# Patient Record
Sex: Female | Born: 1990 | Race: Black or African American | Hispanic: No | Marital: Single | Smoking: Never smoker
Health system: Southern US, Community
[De-identification: ages and names within clinical notes are randomized; demographics above are authoritative.]

## PROBLEM LIST (undated history)

## (undated) ENCOUNTER — Inpatient Hospital Stay (HOSPITAL_COMMUNITY): Payer: Self-pay

## (undated) DIAGNOSIS — O139 Gestational [pregnancy-induced] hypertension without significant proteinuria, unspecified trimester: Secondary | ICD-10-CM

## (undated) DIAGNOSIS — Z3A38 38 weeks gestation of pregnancy: Secondary | ICD-10-CM

## (undated) HISTORY — DX: 38 weeks gestation of pregnancy: Z3A.38

## (undated) HISTORY — PX: WISDOM TOOTH EXTRACTION: SHX21

## (undated) HISTORY — DX: Gestational (pregnancy-induced) hypertension without significant proteinuria, unspecified trimester: O13.9

---

## 2011-01-26 ENCOUNTER — Inpatient Hospital Stay (INDEPENDENT_AMBULATORY_CARE_PROVIDER_SITE_OTHER)
Admission: RE | Admit: 2011-01-26 | Discharge: 2011-01-26 | Disposition: A | Discharge status: Home / Self Care | Payer: Medicaid Other | Source: Ambulatory Visit | Attending: Emergency Medicine | Admitting: Emergency Medicine

## 2011-01-26 ENCOUNTER — Ambulatory Visit (INDEPENDENT_AMBULATORY_CARE_PROVIDER_SITE_OTHER): Payer: Medicaid Other

## 2011-01-26 DIAGNOSIS — M79609 Pain in unspecified limb: Secondary | ICD-10-CM

## 2011-01-26 DIAGNOSIS — M25469 Effusion, unspecified knee: Secondary | ICD-10-CM

## 2011-01-26 LAB — POCT PREGNANCY, URINE: Preg Test, Ur: NEGATIVE

## 2011-07-03 ENCOUNTER — Emergency Department (HOSPITAL_COMMUNITY)
Admission: EM | Admit: 2011-07-03 | Discharge: 2011-07-03 | Disposition: A | Discharge status: Home / Self Care | Payer: Self-pay | Attending: Emergency Medicine | Admitting: Emergency Medicine

## 2011-07-03 ENCOUNTER — Encounter (HOSPITAL_COMMUNITY): Payer: Self-pay | Admitting: Emergency Medicine

## 2011-07-03 DIAGNOSIS — R45851 Suicidal ideations: Secondary | ICD-10-CM | POA: Insufficient documentation

## 2011-07-03 LAB — RAPID URINE DRUG SCREEN, HOSP PERFORMED
Barbiturates: NOT DETECTED
Benzodiazepines: NOT DETECTED
Cocaine: NOT DETECTED

## 2011-07-03 NOTE — ED Provider Notes (Addendum)
History     CSN: [id number]  Arrival date & time 07/03/11  2034   First MD Initiated Contact with Patient 07/03/11 2137      Chief Complaint  Patient presents with  . Suicidal  . Medical Clearance  . Drug Overdose    Questionable    (Consider location/radiation/quality/duration/timing/severity/associated sxs/prior treatment) Patient is a 21 y.o. female presenting with mental health disorder. The history is provided by the patient.  Mental Health Problem The primary symptoms include dysphoric mood. Primary symptoms comment: Patient's boyfriend broke up with her today and she is very sad and stated that she mentions suicide The current episode started today. This is a new problem.  The onset of the illness is precipitated by emotional stress. The degree of incapacity that she is experiencing as a consequence of her illness is moderate. Additional symptoms of the illness do not include no insomnia, no appetite change, no unexpected weight change, no psychomotor retardation, no feelings of worthlessness, no decreased need for sleep, not distractible or no poor judgment. Associated symptoms comments: Patient states she is very sad after her boyfriend broke up with her today. She states she was so sad she mentioned to her friend she thought about suicide. She states that she would never actually hurt. She has no access to any weapons.. She admits to suicidal ideas. She does not have a plan to commit suicide. She does not contemplate harming herself. She has not already injured self. She does not contemplate injuring another person. Risk factors: No history of substance abuse, suicide attempts, mental illness.    History reviewed. No pertinent past medical history.  History reviewed. No pertinent past surgical history.  No family history on file.  History  Substance Use Topics  . Smoking status: Never Smoker   . Smokeless tobacco: Not on file  . Alcohol Use: No    OB History    Grav  Para Term Preterm Abortions TAB SAB Ect Mult Living                  Review of Systems  Constitutional: Negative for appetite change and unexpected weight change.  Psychiatric/Behavioral: Positive for dysphoric mood. The patient does not have insomnia.   All other systems reviewed and are negative.    Allergies  Review of patient's allergies indicates no known allergies.  Home Medications  No current outpatient prescriptions on file.  BP 133/78  Pulse 78  Temp 98 F (36.7 C)  Resp 16  SpO2 99%  LMP 06/30/2011  Physical Exam  Nursing note and vitals reviewed. Constitutional: She is oriented to person, place, and time. She appears well-developed and well-nourished. No distress.  HENT:  Head: Normocephalic and atraumatic.  Eyes: EOM are normal. Pupils are equal, round, and reactive to light.  Cardiovascular: Normal rate, regular rhythm, normal heart sounds and intact distal pulses.  Exam reveals no friction rub.   No murmur heard. Pulmonary/Chest: Effort normal and breath sounds normal. She has no wheezes. She has no rales.  Abdominal: Soft. Bowel sounds are normal. She exhibits no distension. There is no tenderness. There is no rebound and no guarding.  Musculoskeletal: Normal range of motion. She exhibits no tenderness.       No edema  Neurological: She is alert and oriented to person, place, and time. No cranial nerve deficit.  Skin: Skin is warm and dry. No rash noted.  Psychiatric: She has a normal mood and affect. Her behavior is normal. She expresses no homicidal  and no suicidal ideation.       Patient denies any suicidal ideation or plan here    ED Course  Procedures (including critical care time)  Labs Reviewed  URINE RAPID DRUG SCREEN (HOSP PERFORMED) - Abnormal; Notable for the following:    Tetrahydrocannabinol POSITIVE (*)    All other components within normal limits  PREGNANCY, URINE  CBC  COMPREHENSIVE METABOLIC PANEL  ETHANOL  ACETAMINOPHEN LEVEL    No results found.   No diagnosis found.    MDM   Patient brought in by EMS today do to possible suicidal ideation. On evaluation of the patient she states she broke up with her boyfriend today and is very sad and said that she thought about killing herself when she was very upset. Here patient is appropriate and states she would never hurt herself and that she was just upset. He has no history of depression or suicide in the past. She takes no medication except for ibuprofen for migraine headaches intermittently. She has a good support system and lives in the dorm close to her best friend. I spoke with her are a separately who corroborated her story. She states that she made no attempts to hurt herself and denies her ever having any issues in the past and she has known her for the past 2 years. Try to contact mother multiple times and went to voicemail. Patient is acting appropriate until she is appropriate for discharge.  Patient will be in a dorm and RA will insure that she is checked upon she also has a roommate.        Gwyneth Sprout, MD 07/03/11 0981  Gwyneth Sprout, MD 07/03/11 2314

## 2011-07-03 NOTE — ED Notes (Signed)
Dr Anitra Lauth bedside

## 2011-07-03 NOTE — ED Notes (Signed)
Attempt to call mother w/o success, left message to return call

## 2011-07-03 NOTE — ED Notes (Signed)
MD at bedside. 

## 2011-07-03 NOTE — ED Notes (Signed)
Pt presents via EMS, ? OD, SI, per EMS, presents with bottles of Vicodin, IBU, pills appear accounted for, pt non verbal, resp even unlabored, skin pwd, IV est PTA

## 2011-07-03 NOTE — ED Notes (Signed)
Pt mother calls ER, requesting info on pt, pt provided telephone to talk with mother.

## 2011-07-03 NOTE — ED Notes (Signed)
Sitter @ bedside

## 2011-07-03 NOTE — ED Notes (Signed)
Pt alert, speaks freely, admits problems with depression, denies Over dose, states took "normal dose of IBU" pt resp even unlabored, skin pwd

## 2011-07-03 NOTE — ED Notes (Signed)
[id number]<VW> Expected date:07/03/11<BR> Expected time: 8:23 PM<BR> Means of arrival:Ambulance<BR> Comments:<BR> EMS 50 GC - suicidal

## 2011-07-03 NOTE — ED Notes (Signed)
Pill bottles presented by EMS, Vicodin 5/500mg  12 pills of 20, filled 05/08/11. IBU 600mg  19 pills of 30 filled 05/08/11

## 2011-07-03 NOTE — Discharge Instructions (Signed)
Stress Management Stress is a state of physical or mental tension that often results from changes in your life or normal routine. Some common causes of stress are:  Death of a loved one.   Injuries or severe illnesses.   Getting fired or changing jobs.   Moving into a new home.  Other causes may be:  Sexual problems.   Business or financial losses.   Taking on a large debt.   Regular conflict with someone at home or at work.   Constant tiredness from lack of sleep.  It is not just bad things that are stressful. It may be stressful to:  Win the lottery.   Get married.   Buy a new car.  The amount of stress that can be easily tolerated varies from person to person. Changes generally cause stress, regardless of the types of change. Too much stress can affect your health. It may lead to physical or emotional problems. Too little stress (boredom) may also become stressful. SUGGESTIONS TO REDUCE STRESS:  Talk things over with your family and friends. It often is helpful to share your concerns and worries. If you feel your problem is serious, you may want to get help from a professional counselor.   Consider your problems one at a time instead of lumping them all together. Trying to take care of everything at once may seem impossible. List all the things you need to do and then start with the most important one. Set a goal to accomplish 2 or 3 things each day. If you expect to do too many in a single day you will naturally fail, causing you to feel even more stressed.   Do not use alcohol or drugs to relieve stress. Although you may feel better for a short time, they do not remove the problems that caused the stress. They can also be habit forming.   Exercise regularly - at least 3 times per week. Physical exercise can help to relieve that "uptight" feeling and will relax you.   The shortest distance between despair and hope is often a good night's sleep.   Go to bed and get up on  time allowing yourself time for appointments without being rushed.   Take a short "time-out" period from any stressful situation that occurs during the day. Close your eyes and take some deep breaths. Starting with the muscles in your face, tense them, hold it for a few seconds, then relax. Repeat this with the muscles in your neck, shoulders, hand, stomach, back and legs.   Take good care of yourself. Eat a balanced diet and get plenty of rest.   Schedule time for having fun. Take a break from your daily routine to relax.  HOME CARE INSTRUCTIONS   Call if you feel overwhelmed by your problems and feel you can no longer manage them on your own.   Return immediately if you feel like hurting yourself or someone else.  Document Released: 10/29/2000 Document Revised: 01/15/2011 Document Reviewed: 06/21/2007 [facility] Patient Information 2012 [place], [place].Suicidal Feelings, How to Help Yourself Everyone feels sad or unhappy at times, but depressing thoughts and feelings of hopelessness can lead to thoughts of suicide. It can seem as if life is too tough to handle. It is as if the mountain is just too high and your climbing skills are not great enough. At that moment these dark thoughts and feelings may seem overwhelming and never ending. It is important to remember these feelings are temporary! They will  go away. If you feel as though you have reached the point where suicide is the only answer, it is time to let someone know immediately. This is the first step to feeling better. The following steps will move you to safer ground and lead you in a positive direction out of depression. HOW TO COPE AND PREVENT SUICIDE  Let family, friends, teachers and/or counselors know. Get help. Try not to isolate yourself from those who care about you. Even though you may not feel sociable or think that you are not good company, talk with someone everyday. It is best if it is face to face. Remember, they will want to  help you.   Eat a regularly spaced and well-balanced diet, and get plenty of rest.   Avoid alcohol and drugs because they will only make you feel worse and may also lower your inhibitions. Remove them from the home. If you are thinking of taking an overdose of your prescribed medications, give your medicines to someone who can give them to you one day at a time. If you are on antidepressants, let your caregiver know of your feelings so he or she can provide a safer medication, if that is a concern.   Remove weapons or poisons from your home.   Try to stick to routines. That may mean just walking the dog or feeding the cat. Follow a schedule and remind yourself that you have to keep that schedule every day. Play with your pets. If it is possible, and you do not have a pet, get one. They give you a sense of well-being, lower your blood pressure and make your heart feel good. They need you, and we all want to be needed.   Set some realistic goals and achieve them. Make a list and cross things off as you go. Accomplishments give a sense of worth. Wait until you are feeling better before doing things you find difficult or unpleasant to do.   If you are able, try to start exercising. Even half-hour periods of exercise each day will make you feel better. Getting out in the sun or into nature helps you recover from depression faster. If you have a favorite place to walk, take advantage of that.   Increase safe activities that have always given you pleasure. This may include playing your favorite music, reading a good book, painting a picture or playing your favorite instrument. Do whatever takes your mind off your depression and puts a smile on your face.   Keep your living space well lit with windows open, and let the sun shine in. Bright light definitely treats depression, not just people with the seasonal affective disorders (SAD).  Above all else remember, depression is temporary. It will go away. Do  not contemplate suicide. Death as a permanent solution is not the answer. Suicide will take away the beautiful rest of life, and do lifelong harm to those around you who love you. Help is available. National Suicide Help Lines with 24 hour help are: 1-800-SUICIDE 332-490-9366 Document Released: 11/09/2002 Document Revised: 01/15/2011 Document Reviewed: 03/30/2007 [facility] Patient Information 2012 [place], [place].

## 2011-07-03 NOTE — ED Notes (Signed)
This RN updated mother on POC per request of pt.

## 2011-07-15 ENCOUNTER — Emergency Department (HOSPITAL_COMMUNITY): Admission: EM | Admit: 2011-07-15 | Discharge: 2011-07-15 | Payer: Self-pay | Source: Home / Self Care

## 2011-07-16 ENCOUNTER — Emergency Department (INDEPENDENT_AMBULATORY_CARE_PROVIDER_SITE_OTHER)
Admission: EM | Admit: 2011-07-16 | Discharge: 2011-07-16 | Disposition: A | Discharge status: Home / Self Care | Payer: Self-pay | Source: Home / Self Care | Attending: Family Medicine | Admitting: Family Medicine

## 2011-07-16 ENCOUNTER — Encounter (HOSPITAL_COMMUNITY): Payer: Self-pay | Admitting: *Deleted

## 2011-07-16 DIAGNOSIS — B373 Candidiasis of vulva and vagina: Secondary | ICD-10-CM

## 2011-07-16 MED ORDER — FLUCONAZOLE 150 MG PO TABS: 150.0000 mg | ORAL_TABLET | Freq: Once | ORAL | Status: AC

## 2011-07-16 MED ORDER — TERCONAZOLE 80 MG VA SUPP: 80.0000 mg | Freq: Every day | VAGINAL | Status: AC

## 2011-07-16 NOTE — ED Provider Notes (Signed)
History     CSN: [id number]  Arrival date & time 07/16/11  Paulo Fruit   First MD Initiated Contact with Patient 07/16/11 1854      Chief Complaint  Patient presents with  . Vaginitis    (Consider location/radiation/quality/duration/timing/severity/associated sxs/prior treatment) Patient is a 21 y.o. female presenting with vaginal itching. The history is provided by the patient.  Vaginal Itching This is a new problem. The current episode started 2 days ago (thick white clumpy d/c). The problem occurs constantly. The problem has not changed since onset.Pertinent negatives include no abdominal pain.    No past medical history on file.  No past surgical history on file.  No family history on file.  History  Substance Use Topics  . Smoking status: Never Smoker   . Smokeless tobacco: Not on file  . Alcohol Use: No    OB History    Grav Para Term Preterm Abortions TAB SAB Ect Mult Living                  Review of Systems  Constitutional: Negative.   Gastrointestinal: Negative for abdominal pain.  Genitourinary: Positive for vaginal bleeding and vaginal discharge. Negative for dysuria, frequency, vaginal pain and pelvic pain.    Allergies  Review of patient's allergies indicates no known allergies.  Home Medications   Current Outpatient Rx  Name Route Sig Dispense Refill  . FLUCONAZOLE 150 MG PO TABS Oral Take 1 tablet (150 mg total) by mouth once. 1 tablet 0  . TERCONAZOLE 80 MG VA SUPP Vaginal Place 1 suppository (80 mg total) vaginally at bedtime. 3 suppository 0    BP 153/85  Pulse 59  Temp(Src) 98.8 F (37.1 C) (Oral)  Resp 18  SpO2 100%  LMP 06/30/2011  Physical Exam  Nursing note and vitals reviewed. Constitutional: She is oriented to person, place, and time. She appears well-developed and well-nourished.  Abdominal: Soft. Bowel sounds are normal. There is no tenderness. There is no rebound and no guarding.  Neurological: She is alert and oriented to  person, place, and time.  Skin: Skin is warm and dry.    ED Course  Procedures (including critical care time)  Labs Reviewed - No data to display No results found.   1. Candida vaginitis       MDM          Barkley Bruns, MD 07/18/11 [phone]

## 2011-07-16 NOTE — ED Notes (Signed)
Pt states she has a white, clumpy vaginal discharge that is very itchy.  Onset 2 days ago.  Denies any urinary sx or foul odor.

## 2011-07-25 ENCOUNTER — Emergency Department (HOSPITAL_COMMUNITY)
Admission: EM | Admit: 2011-07-25 | Discharge: 2011-07-25 | Disposition: A | Discharge status: Home Health | Payer: Self-pay | Attending: Emergency Medicine | Admitting: Emergency Medicine

## 2011-07-25 ENCOUNTER — Encounter (HOSPITAL_COMMUNITY): Payer: Self-pay | Admitting: Emergency Medicine

## 2011-07-25 DIAGNOSIS — R509 Fever, unspecified: Secondary | ICD-10-CM | POA: Insufficient documentation

## 2011-07-25 DIAGNOSIS — J029 Acute pharyngitis, unspecified: Secondary | ICD-10-CM | POA: Insufficient documentation

## 2011-07-25 DIAGNOSIS — R51 Headache: Secondary | ICD-10-CM | POA: Insufficient documentation

## 2011-07-25 DIAGNOSIS — N898 Other specified noninflammatory disorders of vagina: Secondary | ICD-10-CM | POA: Insufficient documentation

## 2011-07-25 DIAGNOSIS — N390 Urinary tract infection, site not specified: Secondary | ICD-10-CM | POA: Insufficient documentation

## 2011-07-25 DIAGNOSIS — IMO0001 Reserved for inherently not codable concepts without codable children: Secondary | ICD-10-CM | POA: Insufficient documentation

## 2011-07-25 LAB — URINE MICROSCOPIC-ADD ON

## 2011-07-25 LAB — WET PREP, GENITAL
Clue Cells Wet Prep HPF POC: NONE SEEN
Yeast Wet Prep HPF POC: NONE SEEN

## 2011-07-25 LAB — POCT PREGNANCY, URINE: Preg Test, Ur: NEGATIVE

## 2011-07-25 LAB — URINALYSIS, ROUTINE W REFLEX MICROSCOPIC
Glucose, UA: NEGATIVE mg/dL
Specific Gravity, Urine: 1.042 — ABNORMAL HIGH (ref 1.005–1.030)

## 2011-07-25 MED ORDER — AZITHROMYCIN 250 MG PO TABS
1000.0000 mg | ORAL_TABLET | Freq: Once | ORAL | Status: AC
  Administered 2011-07-25: 1000 mg via ORAL
  Filled 2011-07-25: qty 4

## 2011-07-25 MED ORDER — ONDANSETRON 4 MG PO TBDP
8.0000 mg | ORAL_TABLET | Freq: Once | ORAL | Status: AC
  Administered 2011-07-25: 8 mg via ORAL

## 2011-07-25 MED ORDER — LIDOCAINE HCL (PF) 1 % IJ SOLN
INTRAMUSCULAR | Status: AC
  Administered 2011-07-25: 1.2 mL via INTRAMUSCULAR
  Filled 2011-07-25: qty 5

## 2011-07-25 MED ORDER — CEFTRIAXONE SODIUM 250 MG IJ SOLR
250.0000 mg | Freq: Once | INTRAMUSCULAR | Status: AC
  Administered 2011-07-25: 250 mg via INTRAMUSCULAR
  Filled 2011-07-25: qty 250

## 2011-07-25 MED ORDER — CEPHALEXIN 250 MG PO CAPS: 250.0000 mg | ORAL_CAPSULE | Freq: Four times a day (QID) | ORAL | Status: AC

## 2011-07-25 MED ORDER — ONDANSETRON 4 MG PO TBDP
ORAL_TABLET | ORAL | Status: AC
  Administered 2011-07-25: 8 mg via ORAL
  Filled 2011-07-25: qty 2

## 2011-07-25 NOTE — ED Notes (Signed)
Pt undressed, in gown, on continuous pulse oximetry and blood pressure cuff 

## 2011-07-25 NOTE — ED Notes (Signed)
Pt with nausea and dry heaving after taking medications, pt given zofran per protocol

## 2011-07-25 NOTE — ED Provider Notes (Signed)
History     CSN: [id number]  Arrival date & time 07/25/11  0907   First MD Initiated Contact with Patient 07/25/11 [phone]      Chief Complaint  Patient presents with  . Influenza  . 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 Patient states she had a temp of 103 at home yesterday. She denies visual changes, neck stiffness, dizziness, CP, cough, runny nose, earache, CP, SOB, abdominal pain, n/v/d, rash. Patient is also complaining of a few day hx of mild dysuria and brown tinged vaginal d/c. Patient states she uses depo provera for [facility] and is due for the start of her next menstrual cycle but has hx of spotting with depo. She states she is sexually active with one partner but without consistent protection.  History reviewed. No pertinent past medical history.  History reviewed. No pertinent past surgical history.  History reviewed. No pertinent family history.  History  Substance Use Topics  . Smoking status: Never Smoker   . Smokeless tobacco: Not on file  . Alcohol Use: No    OB History    Grav Para Term Preterm Abortions TAB SAB Ect Mult Living                  Review of Systems  All other systems reviewed and are negative.    Allergies  Review of patient's allergies indicates no known allergies.  Home Medications   Current Outpatient Rx  Name Route Sig Dispense Refill  . CHLORPHEN-PSEUDOEPHED-APAP 2-30-500 MG PO TABS Oral Take 1 tablet by mouth every 4 (four) hours as needed. For cold    . PSEUDOEPHEDRINE-ACETAMINOPHEN 30-500 MG PO TABS Oral Take 1 tablet by mouth every 4 (four) hours as needed. For cold      BP 116/73   Pulse 72  Temp(Src) 99 F (37.2 C) (Oral)  Resp 16  SpO2 100%  LMP 06/30/2011  Physical Exam  Vitals reviewed. Constitutional: She is oriented to person, place, and time. She appears well-developed and well-nourished. No distress.  HENT:  Head: Normocephalic and atraumatic.  Right Ear: External ear normal.  Left Ear: External ear normal.  Nose: Nose normal.  Mouth/Throat: No oropharyngeal exudate.       moderate erythema of posterior pharynx and tonsils no tonsillar exudate or enlargement. Patent airway. Swallowing secretions well  Eyes: Conjunctivae and EOM are normal. Pupils are equal, round, and reactive to light.  Neck: Normal range of motion. Neck supple.  Cardiovascular: Normal rate, regular rhythm and normal heart sounds.  Exam reveals no gallop and no friction rub.   No murmur heard. Pulmonary/Chest: Effort normal and breath sounds normal. No respiratory distress. She has no wheezes. She has no rales. She exhibits no tenderness.  Abdominal: Soft. Bowel sounds are normal. She exhibits no distension and no mass. There is no tenderness. There is no rebound and no guarding.  Genitourinary: Vagina normal. There is no rash, tenderness or lesion on the right labia. There is no rash, tenderness or lesion on the left labia. Cervix exhibits discharge. Cervix exhibits no motion tenderness. Right adnexum displays no mass and no tenderness. Left adnexum displays no mass and no tenderness.       Thick  brown tinged moderate amount of cervical discharge  Lymphadenopathy:    She has no cervical adenopathy.  Neurological: She is alert and oriented to person, place, and time. She has normal reflexes.  Skin: Skin is warm and dry. No rash noted. She is not diaphoretic.  Psychiatric: She has a normal mood and affect.    ED Course  Procedures (including critical care time)  PO zithromax and IM rocephin  Labs Reviewed  URINALYSIS, ROUTINE W REFLEX MICROSCOPIC - Abnormal; Notable for the  following:    APPearance CLOUDY (*)    Specific Gravity, Urine 1.042 (*)    Hgb urine dipstick MODERATE (*)    Ketones, ur 15 (*)    Protein, ur 30 (*)    Leukocytes, UA SMALL (*)    All other components within normal limits  WET PREP, GENITAL - Abnormal; Notable for the following:    WBC, Wet Prep HPF POC TOO NUMEROUS TO COUNT (*)    All other components within normal limits  URINE MICROSCOPIC-ADD ON - Abnormal; Notable for the following:    Squamous Epithelial / LPF FEW (*)    All other components within normal limits  POCT PREGNANCY, URINE  GC/CHLAMYDIA PROBE AMP, GENITAL   No results found.   1. Pharyngitis   2. Vaginal Discharge   3. Urinary tract infection       MDM  Patient given zithromax and ceftriaxone in ER to cover for GC and chlamydia prophylactically. Will send patient out on keflex to cover for both possible strep and UTI. No signs or symptoms of tonsillar abscess. Patent airway. Afebrile and non toxic appearing. No CMT or abdominal pain. No adnexal TTP.         [place], [place] 07/28/11 1004

## 2011-07-25 NOTE — ED Notes (Signed)
Pt c/o flu like sx with sore throat and fever with HA x several days; pt c/o brown vaginal discharge without odor

## 2011-07-25 NOTE — ED Notes (Signed)
Pt d/c'd from monitor, continuous pulse oximetry and blood pressure cuff; pt getting dressed to be discharged home 

## 2011-07-25 NOTE — Discharge Instructions (Signed)
Take antibiotic in completion to cover for both urinary tract infection and to cover for possible strep throat. If your gonorrhea or chlamydia were positive then the hospital will call you with results however you have been treated for [facility] and chlamydia in ER. Continue to take ibuprofen and tylenol at home for aches and pains but return to ER for emergent changing or worsening of symptoms.   Pharyngitis, Viral and Bacterial Pharyngitis is soreness (inflammation) or infection of the pharynx. It is also called a sore throat. CAUSES  Most sore throats are caused by viruses and are part of a cold. However, some sore throats are caused by strep and other bacteria. Sore throats can also be caused by post nasal drip from draining sinuses, allergies and sometimes from sleeping with an open mouth. Infectious sore throats can be spread from person to person by coughing, sneezing and sharing cups or eating utensils. TREATMENT  Sore throats that are viral usually last 3-4 days. Viral illness will get better without medications (antibiotics). Strep throat and other bacterial infections will usually begin to get better about 24-48 hours after you begin to take antibiotics. HOME CARE INSTRUCTIONS   If the caregiver feels there is a bacterial infection or if there is a positive strep test, they will prescribe an antibiotic. The full course of antibiotics must be taken. If the full course of antibiotic is not taken, you or your child may become ill again. If you or your child has strep throat and do not finish all of the medication, serious heart or kidney diseases may develop.   Drink enough water and fluids to keep your urine clear or pale yellow.   Only take over-the-counter or prescription medicines for pain, discomfort or fever as directed by your caregiver.   Get lots of rest.   Gargle with salt water ( tsp. of salt in a glass of water) as often as every 1-2 hours as you need for comfort.   Hard candies may  soothe the throat if individual is not at risk for choking. Throat sprays or lozenges may also be used.  SEEK MEDICAL CARE IF:   Large, tender lumps in the neck develop.   A rash develops.   Green, yellow-brown or bloody sputum is coughed up.   Your baby is older than 3 months with a rectal temperature of 100.5 F (38.1 C) or higher for more than 1 day.  SEEK IMMEDIATE MEDICAL CARE IF:   A stiff neck develops.   You or your child are drooling or unable to swallow liquids.   You or your child are vomiting, unable to keep medications or liquids down.   You or your child has severe pain, unrelieved with recommended medications.   You or your child are having difficulty breathing (not due to stuffy nose).   You or your child are unable to fully open your mouth.   You or your child develop redness, swelling, or severe pain anywhere on the neck.   You have a fever.   Your baby is older than 3 months with a rectal temperature of 102 F (38.9 C) or higher.   Your baby is 63 months old or younger with a rectal temperature of 100.4 F (38 C) or higher.  MAKE SURE YOU:   Understand these instructions.   Will watch your condition.   Will get help right away if you are not doing well or get worse.  Document Released: 05/05/2005 Document Revised: 04/24/2011 Document Reviewed: 08/02/2007  Exit[employer] Patient Information 2012 [place], [place].  Urinary Tract Infection Infections of the urinary tract can start in several places. A bladder infection (cystitis), a kidney infection (pyelonephritis), and a prostate infection (prostatitis) are different types of urinary tract infections (UTIs). They usually get better if treated with medicines (antibiotics) that kill germs. Take all the medicine until it is gone. You or your child may feel better in a few days, but TAKE ALL MEDICINE or the infection may not respond and may become more difficult to treat. HOME CARE INSTRUCTIONS   Drink enough  water and fluids to keep the urine clear or pale yellow. Cranberry juice is especially recommended, in addition to large amounts of water.   Avoid caffeine, tea, and carbonated beverages. They tend to irritate the bladder.   Alcohol may irritate the prostate.   Only take over-the-counter or prescription medicines for pain, discomfort, or fever as directed by your caregiver.  To prevent further infections:  Empty the bladder often. Avoid holding urine for long periods of time.   After a bowel movement, women should cleanse from front to back. Use each tissue only once.   Empty the bladder before and after sexual intercourse.  FINDING OUT THE RESULTS OF YOUR TEST Not all test results are available during your visit. If your or your child's test results are not back during the visit, make an appointment with your caregiver to find out the results. Do not assume everything is normal if you have not heard from your caregiver or the medical facility. It is important for you to follow up on all test results. SEEK MEDICAL CARE IF:   There is back pain.   Your baby is older than 3 months with a rectal temperature of 100.5 F (38.1 C) or higher for more than 1 day.   Your or your child's problems (symptoms) are no better in 3 days. Return sooner if you or your child is getting worse.  SEEK IMMEDIATE MEDICAL CARE IF:   There is severe back pain or lower abdominal pain.   You or your child develops chills.   You have a fever.   Your baby is older than 3 months with a rectal temperature of 102 F (38.9 C) or higher.   Your baby is 48 months old or younger with a rectal temperature of 100.4 F (38 C) or higher.   There is nausea or vomiting.   There is continued burning or discomfort with urination.  MAKE SURE YOU:   Understand these instructions.   Will watch your condition.   Will get help right away if you are not doing well or get worse.  Document Released: 02/12/2005 Document  Revised: 04/24/2011 Document Reviewed: 09/17/2006 [facility] Patient Information 2012 [place], [place].

## 2011-07-25 NOTE — ED Notes (Signed)
Patient felt better after zofran given

## 2011-07-29 NOTE — ED Provider Notes (Signed)
Medical screening examination/treatment/procedure(s) were performed by non-physician practitioner and as supervising physician I was immediately available for consultation/collaboration. Uriah Philipson, MD, FACEP   Kesia Dalto L Velmer Woelfel, MD 07/29/11 1950 

## 2012-08-25 IMAGING — CR DG KNEE COMPLETE 4+V*R*
4 series · 4 of 4 positions shown · non-contrast
Comparison: None

CLINICAL DATA: Injury with pain

RIGHT KNEE - COMPLETE 4+ VIEW

[view not recorded (1 of 4)]
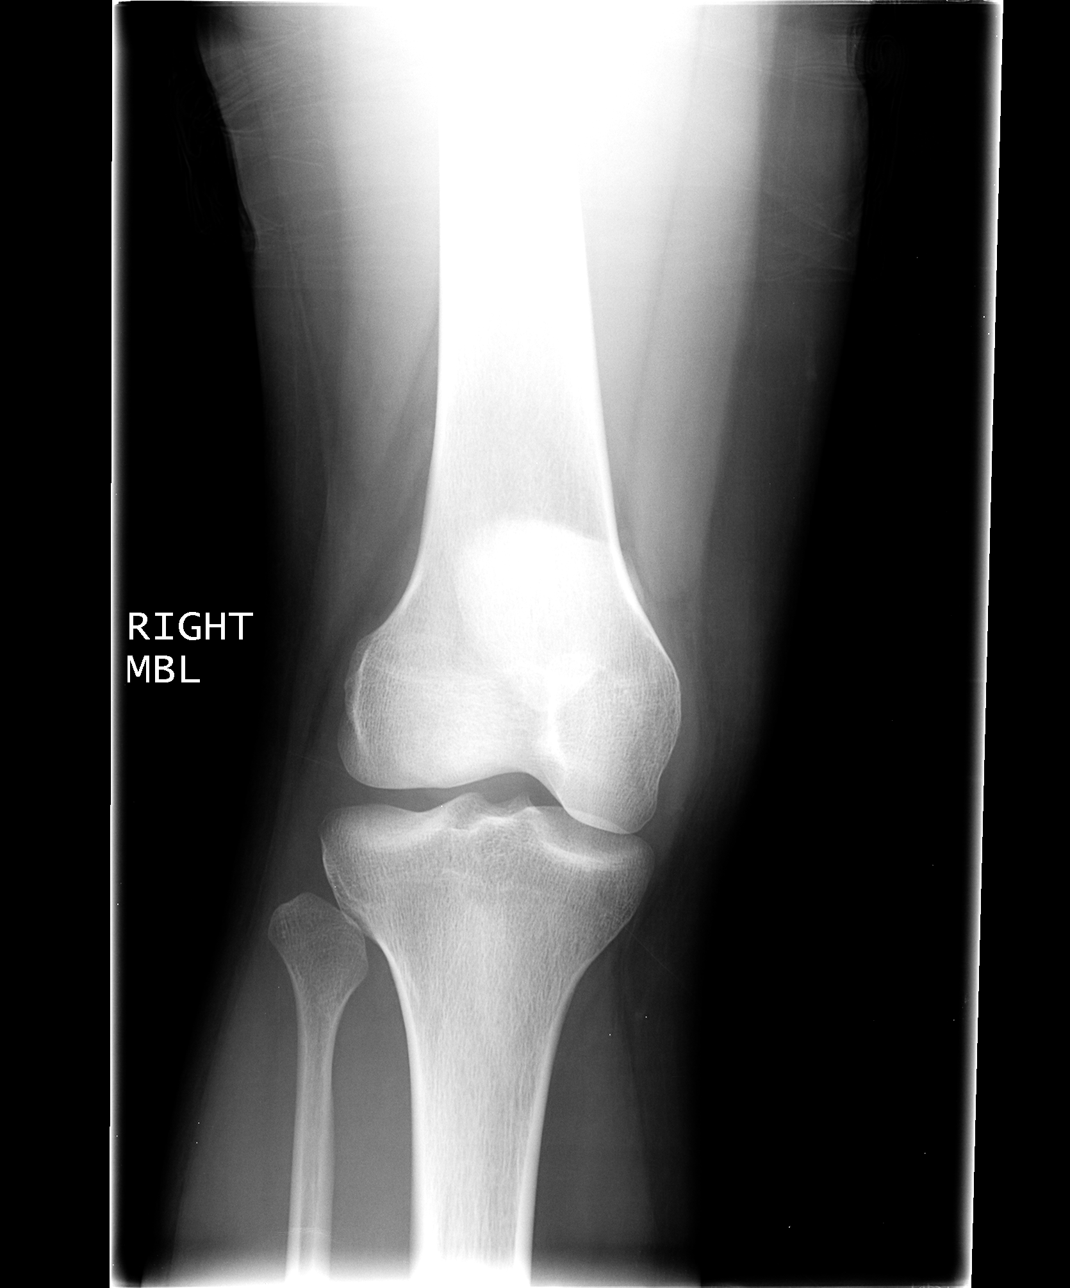

[view not recorded (2 of 4)]
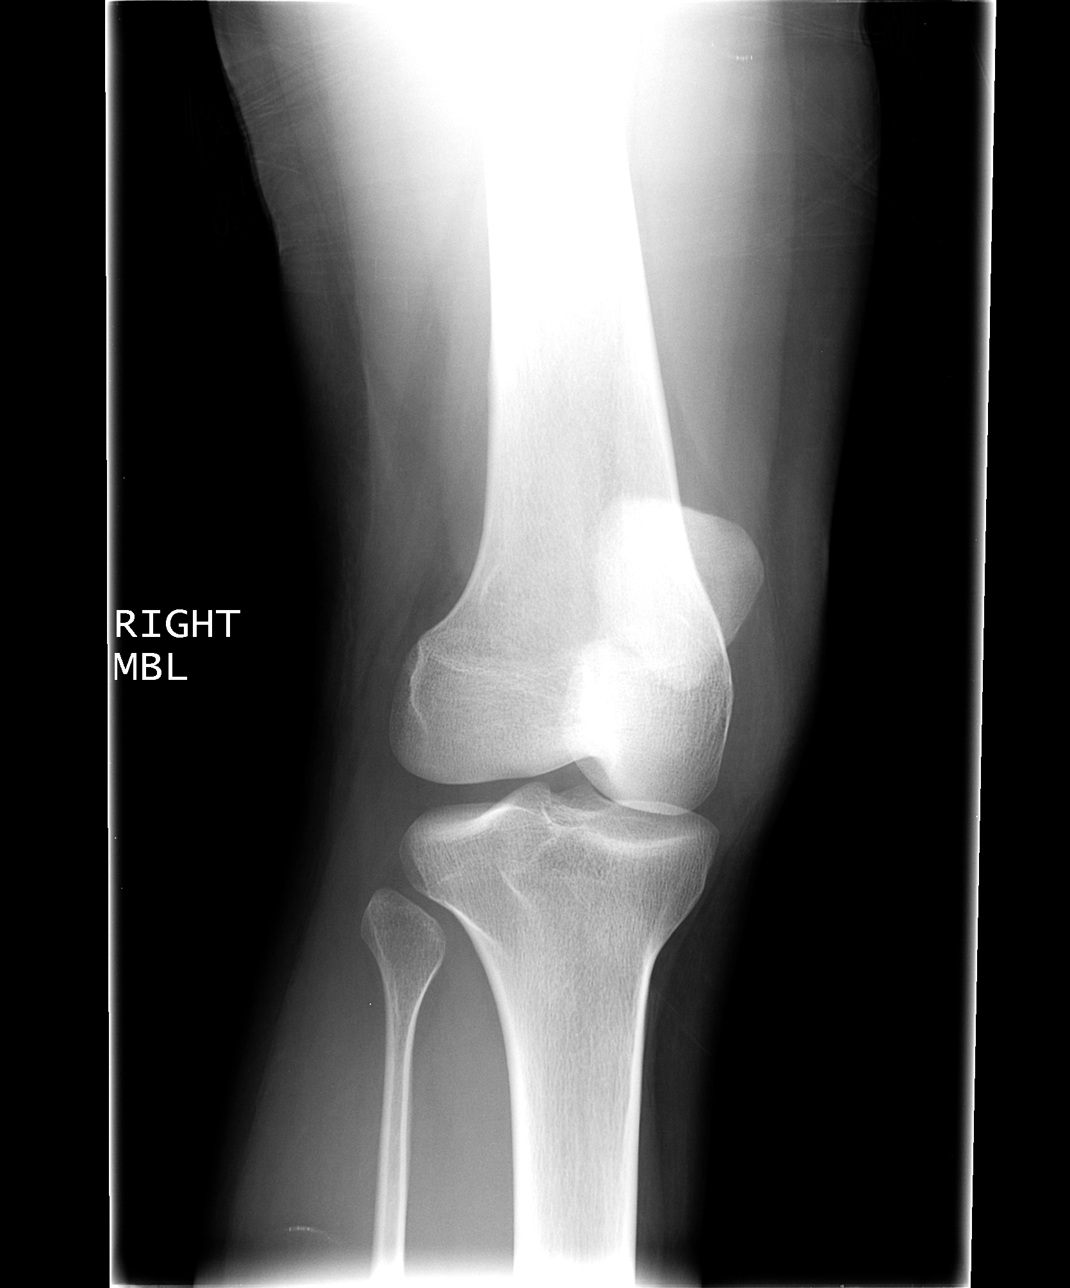

[view not recorded (3 of 4)]
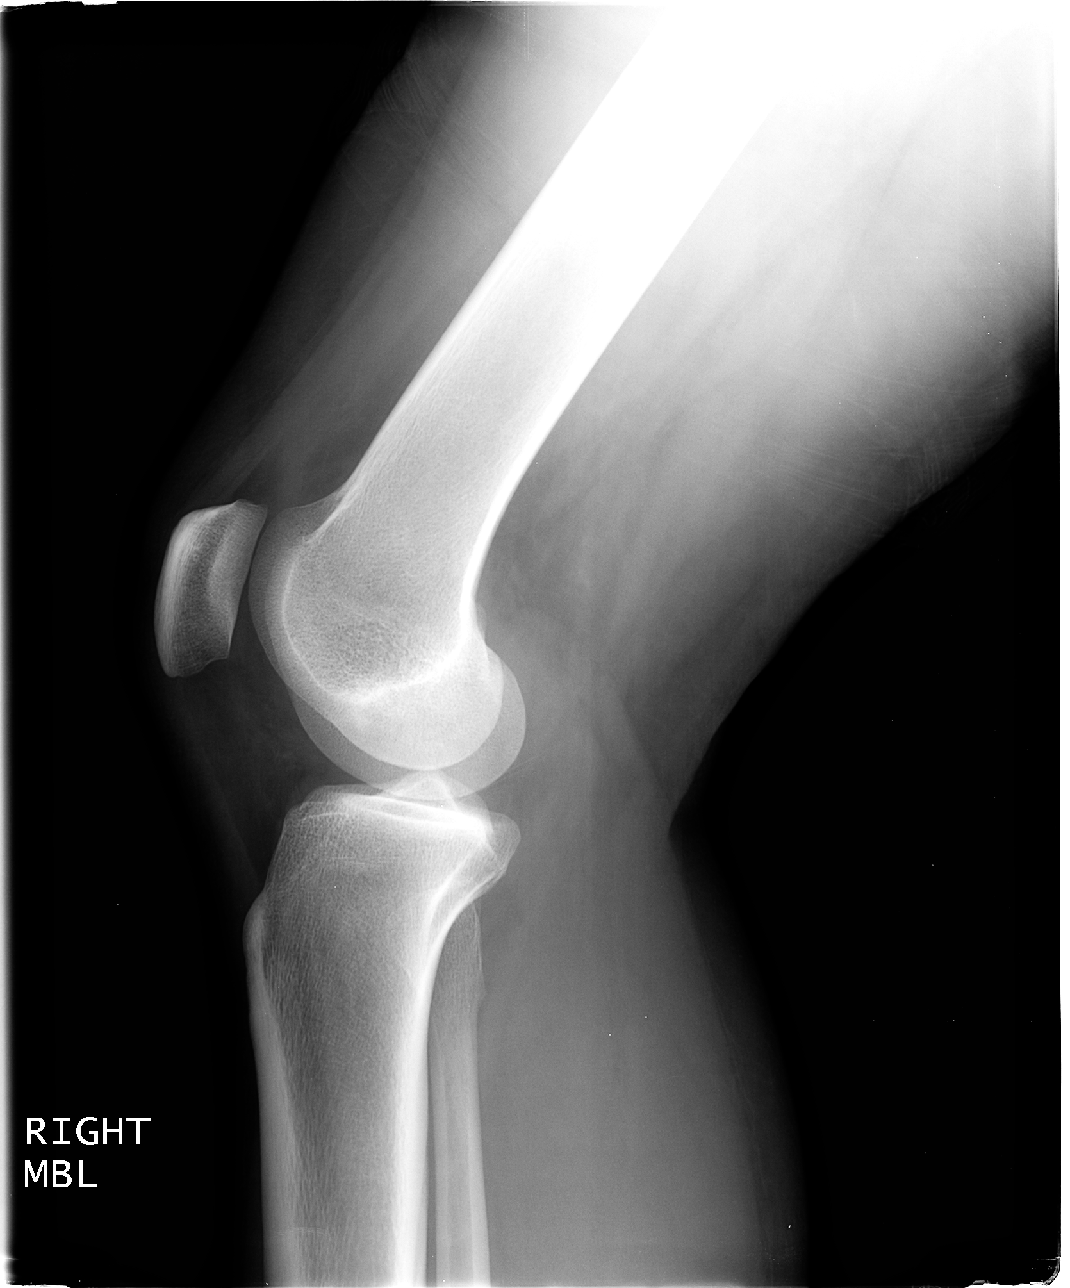

[view not recorded (4 of 4)]
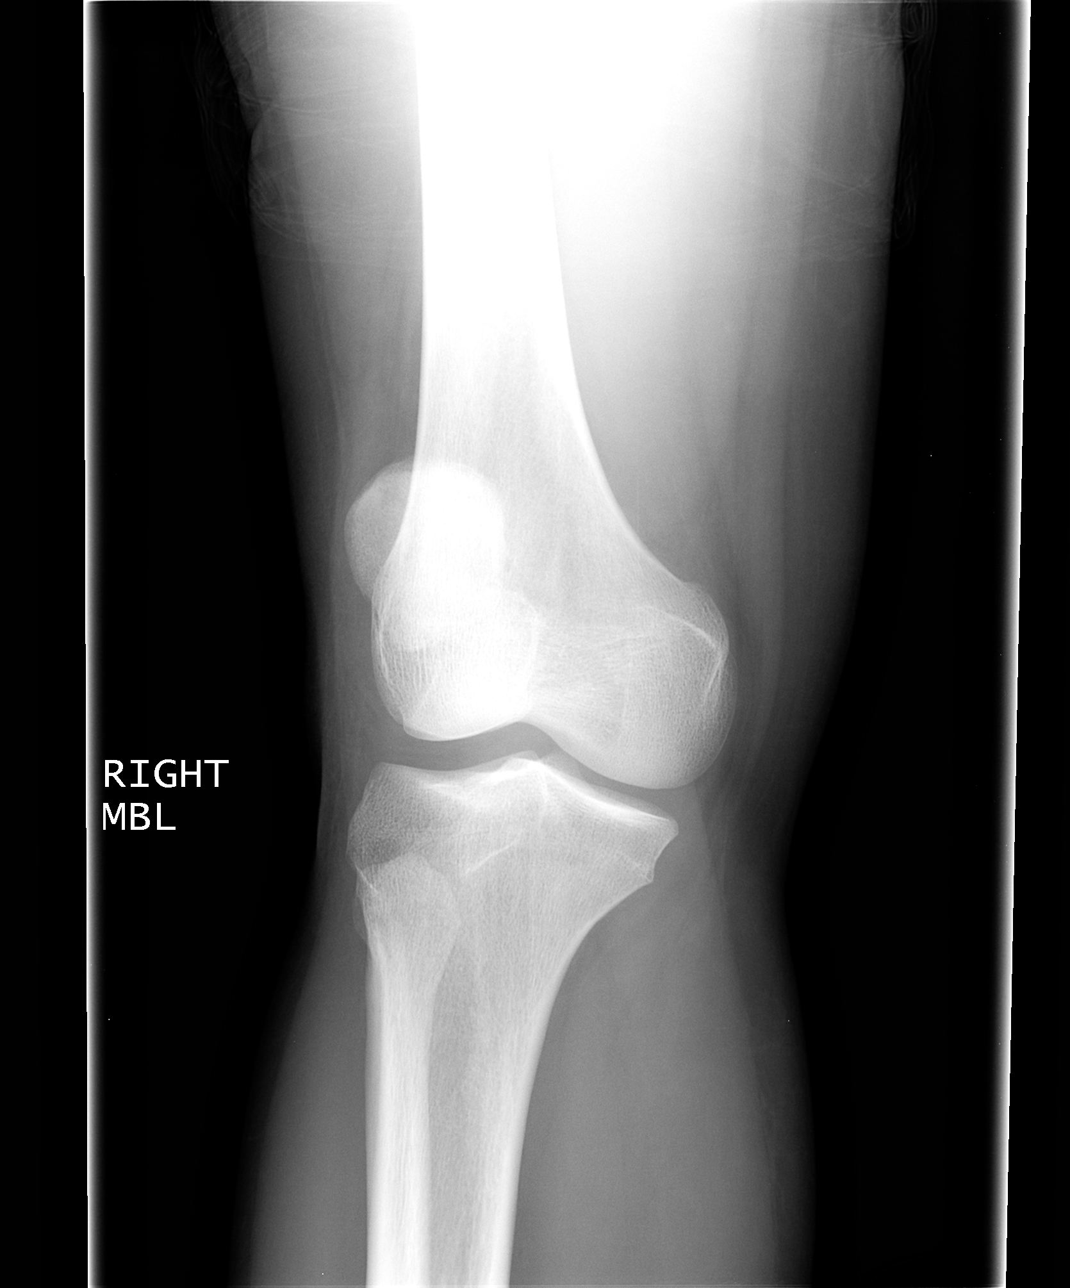

[4 of 4 positions shown; findings below may reference images not displayed]

FINDINGS: There is a small knee joint effusion.  No evidence of
fracture, dislocation or other focal finding.
IMPRESSION: Small joint effusion.  No other abnormality.

## 2013-04-30 ENCOUNTER — Encounter (HOSPITAL_COMMUNITY): Payer: Self-pay | Admitting: Emergency Medicine

## 2013-04-30 ENCOUNTER — Emergency Department (HOSPITAL_COMMUNITY)
Admission: EM | Admit: 2013-04-30 | Discharge: 2013-04-30 | Disposition: A | Discharge status: Home / Self Care | Payer: BC Managed Care – PPO | Attending: Emergency Medicine | Admitting: Emergency Medicine

## 2013-04-30 DIAGNOSIS — K5289 Other specified noninfective gastroenteritis and colitis: Secondary | ICD-10-CM | POA: Insufficient documentation

## 2013-04-30 DIAGNOSIS — Z3202 Encounter for pregnancy test, result negative: Secondary | ICD-10-CM | POA: Insufficient documentation

## 2013-04-30 DIAGNOSIS — K529 Noninfective gastroenteritis and colitis, unspecified: Secondary | ICD-10-CM

## 2013-04-30 LAB — COMPREHENSIVE METABOLIC PANEL
ALT: 12 U/L (ref 0–35)
Alkaline Phosphatase: 73 U/L (ref 39–117)
GFR calc Af Amer: >90 mL/min (ref >90)
Glucose, Bld: 89 mg/dL (ref 70–99)
Potassium: 3.4 mEq/L — ABNORMAL LOW (ref 3.5–5.1)
Sodium: 140 mEq/L (ref 135–145)
Total Protein: 8.6 g/dL — ABNORMAL HIGH (ref 6.0–8.3)

## 2013-04-30 LAB — URINE MICROSCOPIC-ADD ON

## 2013-04-30 LAB — CBC WITH DIFFERENTIAL/PLATELET
Eosinophils Absolute: 0 10*3/uL (ref 0.0–0.7)
Lymphocytes Relative: 29 % (ref 12–46)
Lymphs Abs: 2.5 10*3/uL (ref 0.7–4.0)
MCH: 33.3 pg (ref 26.0–34.0)
Neutro Abs: 5.5 10*3/uL (ref 1.7–7.7)
Neutrophils Relative %: 64 % (ref 43–77)
Platelets: 340 10*3/uL (ref 150–400)
RBC: 4.66 MIL/uL (ref 3.87–5.11)
WBC: 8.6 10*3/uL (ref 4.0–10.5)

## 2013-04-30 LAB — URINALYSIS, ROUTINE W REFLEX MICROSCOPIC
Leukocytes, UA: NEGATIVE
Nitrite: NEGATIVE
Specific Gravity, Urine: 1.042 — ABNORMAL HIGH (ref 1.005–1.030)
pH: 6 (ref 5.0–8.0)

## 2013-04-30 LAB — PREGNANCY, URINE: Preg Test, Ur: NEGATIVE

## 2013-04-30 MED ORDER — KETOROLAC TROMETHAMINE 30 MG/ML IJ SOLN
30.0000 mg | Freq: Once | INTRAMUSCULAR | Status: AC
  Administered 2013-04-30: 30 mg via INTRAVENOUS
  Filled 2013-04-30: qty 1

## 2013-04-30 MED ORDER — SODIUM CHLORIDE 0.9 % IV BOLUS (SEPSIS)
1000.0000 mL | Freq: Once | INTRAVENOUS | Status: AC
  Administered 2013-04-30: 1000 mL via INTRAVENOUS

## 2013-04-30 MED ORDER — ONDANSETRON HCL 4 MG/2ML IJ SOLN
4.0000 mg | Freq: Once | INTRAMUSCULAR | Status: AC
  Administered 2013-04-30: 4 mg via INTRAVENOUS
  Filled 2013-04-30: qty 2

## 2013-04-30 NOTE — ED Notes (Signed)
The pt has had nausea for one week and pain in her abd when she lies on it for one week with some diarrhea.  lmp now.  She was on depo shot but does not take them for the past 6 months

## 2013-04-30 NOTE — ED Provider Notes (Signed)
I saw and evaluated the patient, reviewed the resident's note and I agree with the findings and plan.  EKG Interpretation   None       Patient presents with nausea and vomiting, abdominal cramping. Exam is benign. Laparoscopic benign. Patient treated with fluids and antiemetics, treated for viral syndrome.  Gilda Crease, MD 04/30/13 [phone]

## 2013-04-30 NOTE — ED Provider Notes (Signed)
CSN: [id number]     Arrival date & time 04/30/13  1509 History   First MD Initiated Contact with Patient 04/30/13 1632     Chief Complaint  Patient presents with  . Emesis   (Consider location/radiation/quality/duration/timing/severity/associated sxs/prior Treatment) Patient is a 22 y.o. female presenting with vomiting.  Emesis Associated symptoms: abdominal pain and diarrhea   Associated symptoms: no chills and no headaches    Tammy Mccann is a 22 y.o. female who presented to the emergency department for concern of nausea, vomiting, diarrhea, and abdominal cramping.  Patient reports that the queasiness and diarrhea started 1 week ago.  Then today, she had two episodes of NBNB vomiting.  Symptoms mild.  No alleviating/aggravating factors.  No vaginal discharge.  Patient on period now.  No pelvic pain.  No changes in urination.  No other symptoms.    History reviewed. No pertinent past medical history. History reviewed. No pertinent past surgical history.  Family History: Reviewed.  None pertinent.     History  Substance Use Topics  . Smoking status: Never Smoker   . Smokeless tobacco: Not on file  . Alcohol Use: No   OB History   Grav Para Term Preterm Abortions TAB SAB Ect Mult Living                 Review of Systems  Constitutional: Negative for fever and chills.  HENT: Negative for congestion and rhinorrhea.   Respiratory: Negative for cough and shortness of breath.   Cardiovascular: Negative for chest pain.  Gastrointestinal: Positive for nausea, vomiting, abdominal pain and diarrhea. Negative for abdominal distention.  Endocrine: Negative for polyuria.  Genitourinary: Negative for dysuria.  Musculoskeletal: Negative for neck pain and neck stiffness.  Skin: Negative for rash.  Neurological: Negative for headaches.  Psychiatric/Behavioral: Negative.     Allergies  Review of patient's allergies indicates no known allergies.  Home Medications   Current  Outpatient Rx  Name  Route  Sig  Dispense  Refill  . bismuth subsalicylate (PEPTO BISMOL) 262 MG chewable tablet   Oral   Chew 524 mg by mouth daily as needed (quizy stomach).          BP 131/88  Pulse 79  Temp(Src) 99 F (37.2 C) (Oral)  Resp 18  Ht 5\' 1"  (1.549 m)  Wt 117 lb 14.4 oz (53.479 kg)  BMI 22.29 kg/m2  SpO2 100%  LMP 04/30/2013 Physical Exam  Nursing note and vitals reviewed. Constitutional: She is oriented to person, place, and time. She appears well-developed and well-nourished. No distress.  HENT:  Head: Normocephalic and atraumatic.  Right Ear: External ear normal.  Left Ear: External ear normal.  Nose: Nose normal.  Mouth/Throat: Oropharynx is clear and moist. No oropharyngeal exudate.  Eyes: EOM are normal. Pupils are equal, round, and reactive to light.  Neck: Normal range of motion. Neck supple. No tracheal deviation present.  Cardiovascular: Normal rate.   Pulmonary/Chest: Effort normal and breath sounds normal. No stridor. No respiratory distress. She has no wheezes. She has no rales.  Abdominal: Soft. She exhibits no distension. There is no tenderness. There is no rebound.  Musculoskeletal: Normal range of motion.  Neurological: She is alert and oriented to person, place, and time.  Skin: Skin is warm and dry. She is not diaphoretic.    ED Course  Procedures (including critical care time) Labs Review Labs Reviewed  URINALYSIS, ROUTINE W REFLEX MICROSCOPIC - Abnormal; Notable for the following:    Color, Urine AMBER (*)  APPearance CLOUDY (*)    Specific Gravity, Urine 1.042 (*)    Hgb urine dipstick LARGE (*)    Bilirubin Urine SMALL (*)    Ketones, ur 15 (*)    Protein, ur 30 (*)    All other components within normal limits  CBC WITH DIFFERENTIAL - Abnormal; Notable for the following:    Hemoglobin 15.5 (*)    All other components within normal limits  COMPREHENSIVE METABOLIC PANEL - Abnormal; Notable for the following:    Potassium 3.4  (*)    Total Protein 8.6 (*)    GFR calc non Af Amer 88 (*)    All other components within normal limits  PREGNANCY, URINE  LIPASE, BLOOD  URINE MICROSCOPIC-ADD ON   Imaging Review No results found.  EKG Interpretation   None       MDM   1. Gastroenteritis     Tammy Mccann is a 22 y.o. female who presents to the ED with gastroenteritis.  Patient with some mild epigastric cramping sensation but no pain on exam.  Doubt appendicitis, biliary pathology, or obstruction. no pelvic symptoms. No indication for CT.  Labs benign.  Patient tolerating PO.  Zofran, toradol, and NS bolus administered with improvement in symptoms.  Return precautions discussed.  Patient safe for discharge.  Recommend close f/u and recheck if symptoms continued.  Patient discharged.    Arloa Koh, MD 04/30/13 [phone]

## 2013-04-30 NOTE — ED Notes (Signed)
Pt states N/V/D and abdominal pain. Ongoing x1 week. Also reports dizziness and fatigue. Last BM yesterday was runny. 6/10 pain at the time. Denies vaginal discharge. Denies urinary symptoms. LMP: 12/11. Pt is alert and oriented x4. NAD.

## 2013-05-19 NOTE — L&D Delivery Note (Signed)
Delivery Note Pt pushed very well for 2 contractions for delivery.  At 2:58 AM a viable and healthy female was delivered via Vaginal, Spontaneous Delivery (Presentation: OA, LOT  ).  APGAR: 8,9 ; weight P.  Placenta status: delivered, intact .  Cord:  with the following complications: none.  Anesthesia:  epidural Episiotomy: none Lacerations: 1st degree laceration Suture Repair: 3.0 vicryl rapide Est. Blood Loss (mL): 400cc  Mom to postpartum.  Baby to Couplet care / Skin to Skin.  Bovard-Stuckert, Pieper Kasik 02/17/2014, 3:20 AM  Br/Bo; A+; [place] [place] [facility] [place] [place]; Contra?

## 2013-07-27 LAB — OB RESULTS CONSOLE ANTIBODY SCREEN: Antibody Screen: NEGATIVE

## 2013-07-27 LAB — OB RESULTS CONSOLE ABO/RH: RH TYPE: POSITIVE

## 2013-07-31 LAB — OB RESULTS CONSOLE RUBELLA ANTIBODY, IGM: Rubella: IMMUNE

## 2013-07-31 LAB — OB RESULTS CONSOLE GC/CHLAMYDIA
CHLAMYDIA, DNA PROBE: NEGATIVE
Gonorrhea: NEGATIVE

## 2013-07-31 LAB — OB RESULTS CONSOLE HEPATITIS B SURFACE ANTIGEN: Hepatitis B Surface Ag: NEGATIVE

## 2013-08-16 ENCOUNTER — Encounter (HOSPITAL_COMMUNITY): Payer: Self-pay | Admitting: Emergency Medicine

## 2013-08-16 ENCOUNTER — Emergency Department (HOSPITAL_COMMUNITY)
Admission: EM | Admit: 2013-08-16 | Discharge: 2013-08-17 | Disposition: A | Discharge status: Home / Self Care | Payer: BC Managed Care – PPO | Attending: Emergency Medicine | Admitting: Emergency Medicine

## 2013-08-16 DIAGNOSIS — O2390 Unspecified genitourinary tract infection in pregnancy, unspecified trimester: Secondary | ICD-10-CM | POA: Insufficient documentation

## 2013-08-16 DIAGNOSIS — Z79899 Other long term (current) drug therapy: Secondary | ICD-10-CM | POA: Insufficient documentation

## 2013-08-16 DIAGNOSIS — N390 Urinary tract infection, site not specified: Secondary | ICD-10-CM | POA: Insufficient documentation

## 2013-08-16 DIAGNOSIS — J3489 Other specified disorders of nose and nasal sinuses: Secondary | ICD-10-CM | POA: Insufficient documentation

## 2013-08-16 DIAGNOSIS — O219 Vomiting of pregnancy, unspecified: Secondary | ICD-10-CM

## 2013-08-16 DIAGNOSIS — O210 Mild hyperemesis gravidarum: Secondary | ICD-10-CM | POA: Insufficient documentation

## 2013-08-16 LAB — BASIC METABOLIC PANEL
BUN: 8 mg/dL (ref 6–23)
CALCIUM: 9.8 mg/dL (ref 8.4–10.5)
CHLORIDE: 100 meq/L (ref 96–112)
CO2: 19 meq/L (ref 19–32)
Creatinine, Ser: 0.52 mg/dL (ref 0.50–1.10)
GFR calc Af Amer: >90 mL/min (ref >90)
GFR calc non Af Amer: >90 mL/min (ref >90)
Glucose, Bld: 103 mg/dL — ABNORMAL HIGH (ref 70–99)
POTASSIUM: 3.3 meq/L — AB (ref 3.7–5.3)
SODIUM: 135 meq/L — AB (ref 137–147)

## 2013-08-16 LAB — URINALYSIS, ROUTINE W REFLEX MICROSCOPIC
BILIRUBIN URINE: NEGATIVE
GLUCOSE, UA: NEGATIVE mg/dL
Leukocytes, UA: NEGATIVE
Nitrite: NEGATIVE
PH: 5.5 (ref 5.0–8.0)
PROTEIN: 100 mg/dL — AB
Specific Gravity, Urine: 1.035 — ABNORMAL HIGH (ref 1.005–1.030)
Urobilinogen, UA: 0.2 mg/dL (ref 0.0–1.0)

## 2013-08-16 LAB — CBC WITH DIFFERENTIAL/PLATELET
BASOS ABS: 0 10*3/uL (ref 0.0–0.1)
Basophils Relative: 0 % (ref 0–1)
EOS PCT: 0 % (ref 0–5)
Eosinophils Absolute: 0 10*3/uL (ref 0.0–0.7)
HCT: 30.2 % — ABNORMAL LOW (ref 36.0–46.0)
Hemoglobin: 10.7 g/dL — ABNORMAL LOW (ref 12.0–15.0)
LYMPHS ABS: 0.3 10*3/uL — AB (ref 0.7–4.0)
LYMPHS PCT: 4 % — AB (ref 12–46)
MCH: 31.4 pg (ref 26.0–34.0)
MCHC: 35.4 g/dL (ref 30.0–36.0)
MCV: 88.6 fL (ref 78.0–100.0)
Monocytes Absolute: 1.2 10*3/uL — ABNORMAL HIGH (ref 0.1–1.0)
Monocytes Relative: 15 % — ABNORMAL HIGH (ref 3–12)
NEUTROS ABS: 6.5 10*3/uL (ref 1.7–7.7)
NEUTROS PCT: 81 % — AB (ref 43–77)
PLATELETS: 256 10*3/uL (ref 150–400)
RBC: 3.41 MIL/uL — AB (ref 3.87–5.11)
RDW: 11.5 % (ref 11.5–15.5)
WBC: 8.1 10*3/uL (ref 4.0–10.5)

## 2013-08-16 LAB — URINE MICROSCOPIC-ADD ON

## 2013-08-16 MED ORDER — ONDANSETRON HCL 4 MG/2ML IJ SOLN
4.0000 mg | Freq: Once | INTRAMUSCULAR | Status: AC
  Administered 2013-08-16: 4 mg via INTRAVENOUS
  Filled 2013-08-16: qty 2

## 2013-08-16 MED ORDER — CEPHALEXIN 500 MG PO CAPS: 500.0000 mg | ORAL_CAPSULE | Freq: Three times a day (TID) | ORAL | Status: DC

## 2013-08-16 MED ORDER — SODIUM CHLORIDE 0.9 % IV BOLUS (SEPSIS)
1000.0000 mL | Freq: Once | INTRAVENOUS | Status: AC
  Administered 2013-08-16: 1000 mL via INTRAVENOUS

## 2013-08-16 MED ORDER — ACETAMINOPHEN 325 MG PO TABS
650.0000 mg | ORAL_TABLET | Freq: Once | ORAL | Status: AC
  Administered 2013-08-16: 650 mg via ORAL
  Filled 2013-08-16: qty 2

## 2013-08-16 MED ORDER — ONDANSETRON 4 MG PO TBDP: 4.0000 mg | ORAL_TABLET | Freq: Three times a day (TID) | ORAL | Status: DC | PRN

## 2013-08-16 NOTE — Discharge Instructions (Signed)
Take the prescribed medication as directed. °Follow-up with your primary care physician. °Return to the ED for new or worsening symptoms. ° °

## 2013-08-16 NOTE — ED Notes (Signed)
Attempted to give tylenol. Pt unable to tolerate stated last vomited at MD office. "unable to keep anything down" Charge Misty StanleyStacey RN aware of pt current status.

## 2013-08-16 NOTE — ED Notes (Signed)
Pt states that she is [redacted] wks pregnant.  C/o nasal congestion, NV since yesterday.  States she is unable to keep anything down.

## 2013-08-16 NOTE — ED Provider Notes (Signed)
CSN: [id number]     Arrival date & time 08/16/13  1814 History   First MD Initiated Contact with Patient 08/16/13 2005     Chief Complaint  Patient presents with  . Nasal Congestion  . Nausea  . Emesis     (Consider location/radiation/quality/duration/timing/severity/associated sxs/prior Treatment) Patient is a 23 y.o. female presenting with vomiting. The history is provided by the patient and medical records.  Emesis  This is a 23 year old G1P0 approximately [redacted] weeks gestation with confirmed IUP presenting to the ED for nasal congestion, nausea, and vomiting for the past 24 hours.  Patient states for the past several weeks she has been experiencing morning sickness, which was controlled with her home Reglan.  No episodes of bloody or bilious emesis.  States last week for nausea and vomiting seemed to improve but once she began experiencing cold symptoms a few days ago, nausea and vomiting returned.  Specifically pt is having nasal congestion, PND, sinus pressure, headache, and low grade fever with chills.  No known sick contacts.  BM normal.  No urinary sx.  Denies abdominal pain, vaginal bleeding, or loss of vaginal fluids.  Pt febrile and tachycardic on arrival, VS otherwise stable.  History reviewed. No pertinent past medical history. No past surgical history on file. No family history on file. History  Substance Use Topics  . Smoking status: Never Smoker   . Smokeless tobacco: Not on file  . Alcohol Use: No   OB History   Grav Para Term Preterm Abortions TAB SAB Ect Mult Living   1              Review of Systems  Gastrointestinal: Positive for nausea and vomiting.  All other systems reviewed and are negative.   Allergies  Review of patient's allergies indicates no known allergies.  Home Medications   Current Outpatient Rx  Name  Route  Sig  Dispense  Refill  . acetaminophen (TYLENOL) 160 MG/5ML liquid   Oral   Take by mouth every 4 (four) hours as needed for fever or  pain.         Marland Kitchen metoCLOPramide (REGLAN) 10 MG tablet   Oral   Take 10 mg by mouth every 8 (eight) hours as needed for nausea.         . Prenatal Vit-Fe Fumarate-FA (PRENATAL MULTIVITAMIN) TABS tablet   Oral   Take 1 tablet by mouth daily at 12 noon.          BP 131/77  Pulse 120  Temp(Src) 100.3 F (37.9 C) (Oral)  Resp 24  Ht 5\' 1"  (1.549 m)  Wt 115 lb (52.164 kg)  BMI 21.74 kg/m2  SpO2 100%  LMP 05/20/2013  Physical Exam  Nursing note and vitals reviewed. Constitutional: She is oriented to person, place, and time. She appears well-developed and well-nourished. No distress.  HENT:  Head: Normocephalic and atraumatic.  Right Ear: Tympanic membrane and ear canal normal.  Left Ear: Tympanic membrane and ear canal normal.  Nose: Mucosal edema and rhinorrhea present. Right sinus exhibits no maxillary sinus tenderness and no frontal sinus tenderness. Left sinus exhibits no maxillary sinus tenderness and no frontal sinus tenderness.  Mouth/Throat: Uvula is midline and oropharynx is clear and moist. Mucous membranes are dry. No oropharyngeal exudate, posterior oropharyngeal edema, posterior oropharyngeal erythema or tonsillar abscesses.  Turbinates swollen and erythematous with clear rhinorrhea bilaterally; PND noted in oropharynx; mucous membranes dry  Eyes: Conjunctivae and EOM are normal. Pupils are equal, round, and reactive  to light.  Neck: Normal range of motion and full passive range of motion without pain. Neck supple. No rigidity.  No meningeal signs  Cardiovascular: Normal rate, regular rhythm and normal heart sounds.   Pulmonary/Chest: Effort normal and breath sounds normal. No respiratory distress. She has no wheezes.  Abdominal: Soft. Bowel sounds are normal. There is no tenderness. There is no guarding.  Musculoskeletal: Normal range of motion. She exhibits no edema.  Neurological: She is alert and oriented to person, place, and time.  Skin: Skin is warm and dry.  She is not diaphoretic.  Psychiatric: She has a normal mood and affect.    ED Course  Procedures (including critical care time) Labs Review Labs Reviewed  URINALYSIS, ROUTINE W REFLEX MICROSCOPIC - Abnormal; Notable for the following:    APPearance CLOUDY (*)    Specific Gravity, Urine 1.035 (*)    Hgb urine dipstick SMALL (*)    Ketones, ur >80 (*)    Protein, ur 100 (*)    All other components within normal limits  URINE MICROSCOPIC-ADD ON - Abnormal; Notable for the following:    Squamous Epithelial / LPF FEW (*)    Bacteria, UA MANY (*)    Casts HYALINE CASTS (*)    All other components within normal limits  CBC WITH DIFFERENTIAL - Abnormal; Notable for the following:    RBC 3.41 (*)    Hemoglobin 10.7 (*)    HCT 30.2 (*)    Neutrophils Relative % 81 (*)    Lymphocytes Relative 4 (*)    Lymphs Abs 0.3 (*)    Monocytes Relative 15 (*)    Monocytes Absolute 1.2 (*)    All other components within normal limits  BASIC METABOLIC PANEL - Abnormal; Notable for the following:    Sodium 135 (*)    Potassium 3.3 (*)    Glucose, Bld 103 (*)    All other components within normal limits  URINE CULTURE   Imaging Review No results found.   EKG Interpretation None      MDM   Final diagnoses:  UTI (lower urinary tract infection)  Nausea and vomiting in pregnancy   23 y.o. G1P0 with URI sx and persistent N/V despite home treatment.  On exam, she is febrile but overall non-toxic appearing.  She denies any abdominal or vaginal complaints.  Abdominal exam is benign.  Her lungs are CTAB without rhonchi or wheezes to suggest CAP/bronchitis.  Will obtain basic labs.  IVFB, zofran, and tylenol given.  Will continue to monitor closely.  Labs as above.  U/a with many bacteria, culture pending.  Pt was rehydrated and given multiple doses of anti-emetics with good control of N/V, tachycardia resolved.  She has tolerated PO while in the ED without difficulty.  Fever reduced after tylenol,  likely due to her URI vs UTI.  Pt will be started on keflex for UTI.  She will FU closely with PCP.  Discussed plan with pt, she acknowledged understanding and agreed with plan of care.  Strict return precautions advised for new or worsening symptoms.  VS stable at time of discharge. Filed Vitals:   08/17/13 0013  BP: 107/63  Pulse: 88  Temp: 98.4 F (36.9 C)  Resp: [address]     Garlon HatchetLisa M Aairah Negrette, PA-C 08/17/13 0030  Garlon HatchetLisa M Ryman Rathgeber, PA-C 08/17/13 0030

## 2013-08-18 LAB — URINE CULTURE

## 2013-08-18 NOTE — ED Provider Notes (Signed)
Medical screening examination/treatment/procedure(s) were performed by non-physician practitioner and as supervising physician I was immediately available for consultation/collaboration.   EKG Interpretation None       Juliet RudeNathan R. Rubin PayorPickering, MD 08/18/13 [phone]

## 2013-08-25 ENCOUNTER — Encounter (HOSPITAL_COMMUNITY): Payer: Self-pay | Admitting: *Deleted

## 2013-08-25 ENCOUNTER — Inpatient Hospital Stay (HOSPITAL_COMMUNITY)
Admission: AD | Admit: 2013-08-25 | Discharge: 2013-08-25 | Disposition: A | Discharge status: Home / Self Care | Payer: BC Managed Care – PPO | Source: Ambulatory Visit | Attending: Obstetrics and Gynecology | Admitting: Obstetrics and Gynecology

## 2013-08-25 DIAGNOSIS — O210 Mild hyperemesis gravidarum: Secondary | ICD-10-CM

## 2013-08-25 DIAGNOSIS — G44209 Tension-type headache, unspecified, not intractable: Secondary | ICD-10-CM | POA: Diagnosis not present

## 2013-08-25 DIAGNOSIS — O211 Hyperemesis gravidarum with metabolic disturbance: Secondary | ICD-10-CM | POA: Insufficient documentation

## 2013-08-25 DIAGNOSIS — E860 Dehydration: Secondary | ICD-10-CM | POA: Diagnosis not present

## 2013-08-25 DIAGNOSIS — O219 Vomiting of pregnancy, unspecified: Secondary | ICD-10-CM

## 2013-08-25 LAB — URINALYSIS, ROUTINE W REFLEX MICROSCOPIC
BILIRUBIN URINE: NEGATIVE
Glucose, UA: NEGATIVE mg/dL
HGB URINE DIPSTICK: NEGATIVE
KETONES UR: 40 mg/dL — AB
Leukocytes, UA: NEGATIVE
Nitrite: NEGATIVE
PH: 7 (ref 5.0–8.0)
Protein, ur: NEGATIVE mg/dL
SPECIFIC GRAVITY, URINE: 1.015 (ref 1.005–1.030)
UROBILINOGEN UA: 2 mg/dL — AB (ref 0.0–1.0)

## 2013-08-25 LAB — BASIC METABOLIC PANEL
BUN: 9 mg/dL (ref 6–23)
CHLORIDE: 101 meq/L (ref 96–112)
CO2: 22 mEq/L (ref 19–32)
CREATININE: 0.58 mg/dL (ref 0.50–1.10)
Calcium: 10.1 mg/dL (ref 8.4–10.5)
GFR calc Af Amer: >90 mL/min (ref >90)
GFR calc non Af Amer: >90 mL/min (ref >90)
GLUCOSE: 66 mg/dL — AB (ref 70–99)
POTASSIUM: 3.9 meq/L (ref 3.7–5.3)
Sodium: 138 mEq/L (ref 137–147)

## 2013-08-25 MED ORDER — ACETAMINOPHEN 500 MG PO TABS
1000.0000 mg | ORAL_TABLET | Freq: Once | ORAL | Status: AC
  Administered 2013-08-25: 1000 mg via ORAL
  Filled 2013-08-25: qty 2

## 2013-08-25 MED ORDER — LACTATED RINGERS IV SOLN
25.0000 mg | INTRAVENOUS | Status: DC
  Administered 2013-08-25: 25 mg via INTRAVENOUS
  Filled 2013-08-25: qty 1

## 2013-08-25 NOTE — MAU Provider Note (Signed)
Chief Complaint: Emesis   First Provider Initiated Contact with Patient 08/25/13 1512     SUBJECTIVE HPI: Tammy Mccann is a 23 y.o. G1P0 at [redacted]w[redacted]d who presents with report of vomiting several times a day over the last 2 weeks. She skipped down some fluids. She has used Zofran and Reglan with mixed success. States she has lost 10 pounds since pregnancy diagnosed. Fatigued. Has had headache continuously past few days. No tx.for H/A. Marland Kitchen  PN Course: Regular care. Seen here for N/V pregnancy and dehydration, hypokalemia 2 wks ago.   History reviewed. No pertinent past medical history. OB History  Gravida Para Term Preterm AB SAB TAB Ectopic Multiple Living  1             # Outcome Date GA Lbr Len/2nd Weight Sex Delivery Anes PTL Lv  1 CUR              Past Surgical History  Procedure Laterality Date  . Wisdom tooth extraction     History   Social History  . Marital Status: Single    Spouse Name: N/A    Number of Children: N/A  . Years of Education: N/A   Occupational History  . Not on file.   Social History Main Topics  . Smoking status: Never Smoker   . Smokeless tobacco: Not on file  . Alcohol Use: No  . Drug Use: Not on file  . Sexual Activity: Yes    Birth Control/ Protection: Injection   Other Topics Concern  . Not on file   Social History Narrative  . No narrative on file   No current facility-administered medications on file prior to encounter.   Current Outpatient Prescriptions on File Prior to Encounter  Medication Sig Dispense Refill  . acetaminophen (TYLENOL) 160 MG/5ML liquid Take by mouth every 4 (four) hours as needed for fever or pain.      Marland Kitchen metoCLOPramide (REGLAN) 10 MG tablet Take 10 mg by mouth every 8 (eight) hours as needed for nausea.      . ondansetron (ZOFRAN ODT) 4 MG disintegrating tablet Take 1 tablet (4 mg total) by mouth every 8 (eight) hours as needed for nausea.  15 tablet  0  . Prenatal Vit-Fe Fumarate-FA (PRENATAL MULTIVITAMIN) TABS  tablet Take 1 tablet by mouth daily at 12 noon.       No Known Allergies  ROS: Pertinent items in HPI  OBJECTIVE Blood pressure 114/64, pulse 74, temperature 99.2 F (37.3 C), temperature source Oral, resp. rate 16, height 5\' 1"  (1.549 m), weight 53.524 kg (118 lb), last menstrual period 05/20/2013. GENERAL: Well-developed, well-nourished female in no acute distress.  HEENT: Normocephalic HEART: normal rate RESP: normal effort ABDOMEN: Soft, non-tender, S=D, DT 160 EXTREMITIES: Nontender, no edema NEURO: Alert and oriented  LAB RESULTS Results for orders placed during the hospital encounter of 08/25/13 (from the past 24 hour(s))  URINALYSIS, ROUTINE W REFLEX MICROSCOPIC     Status: Abnormal   Collection Time    08/25/13  1:30 PM      Result Value Ref Range   Color, Urine YELLOW  YELLOW   APPearance CLEAR  CLEAR   Specific Gravity, Urine 1.015  1.005 - 1.030   pH 7.0  5.0 - 8.0   Glucose, UA NEGATIVE  NEGATIVE mg/dL   Hgb urine dipstick NEGATIVE  NEGATIVE   Bilirubin Urine NEGATIVE  NEGATIVE   Ketones, ur 40 (*) NEGATIVE mg/dL   Protein, ur NEGATIVE  NEGATIVE mg/dL  Urobilinogen, UA 2.0 (*) 0.0 - 1.0 mg/dL   Nitrite NEGATIVE  NEGATIVE   Leukocytes, UA NEGATIVE  NEGATIVE  BASIC METABOLIC PANEL     Status: Abnormal   Collection Time    08/25/13  3:20 PM      Result Value Ref Range   Sodium 138  137 - 147 mEq/L   Potassium 3.9  3.7 - 5.3 mEq/L   Chloride 101  96 - 112 mEq/L   CO2 22  19 - 32 mEq/L   Glucose, Bld 66 (*) 70 - 99 mg/dL   BUN 9  6 - 23 mg/dL   Creatinine, Ser 4.540.58  0.50 - 1.10 mg/dL   Calcium 09.810.1  8.4 - 11.910.5 mg/dL   GFR calc non Af Amer >90  >90 mL/min   GFR calc Af Amer >90  >90 mL/min    IMAGING No results found.  MAU COURSE IV LR with Phenergan 25mg > felt better, retained water and crackers Acetaminophen 1000mg >  abatement of H/A  ASSESSMENT 1. Nausea and vomiting in pregnancy prior to [redacted] weeks gestation   G1 at 313w6d Headache due to mild  dehydration, tension  PLAN Discharge home Acetaminophen for H/A    Medication List         acetaminophen 160 MG/5ML liquid  Commonly known as:  TYLENOL  Take by mouth every 4 (four) hours as needed for fever or pain.     acetaminophen 500 MG tablet  Commonly known as:  TYLENOL  Take 500 mg by mouth every 6 (six) hours as needed for headache.     metoCLOPramide 10 MG tablet  Commonly known as:  REGLAN  Take 10 mg by mouth every 8 (eight) hours as needed for nausea.     ondansetron 4 MG disintegrating tablet  Commonly known as:  ZOFRAN ODT  Take 1 tablet (4 mg total) by mouth every 8 (eight) hours as needed for nausea.     prenatal multivitamin Tabs tablet  Take 1 tablet by mouth daily at 12 noon.       Follow-up Information   Follow up with Bovard-Stuckert, Augusto GambleJody, MD. (Keep your scheduled prenatal appointment)    Specialty:  Obstetrics and Gynecology   Contact information:   [address] [place] [address] [phone]       Danae Orleanseirdre C Hakan Nudelman, CNM 08/25/2013  5:01 PM

## 2013-08-25 NOTE — MAU Note (Addendum)
hans't been able to keep anything down the past 2 wks.  Has lost 9#. (3 # [facility])

## 2013-08-25 NOTE — Discharge Instructions (Signed)
Morning Sickness °Morning sickness is when you feel sick to your stomach (nauseous) during pregnancy. This nauseous feeling may or may not come with vomiting. It often occurs in the morning but can be a problem any time of day. Morning sickness is most common during the first trimester, but it may continue throughout pregnancy. While morning sickness is unpleasant, it is usually harmless unless you develop severe and continual vomiting (hyperemesis gravidarum). This condition requires more intense treatment.  °CAUSES  °The cause of morning sickness is not completely known but seems to be related to normal hormonal changes that occur in pregnancy. °RISK FACTORS °You are at greater risk if you: °· Experienced nausea or vomiting before your pregnancy. °· Had morning sickness during a previous pregnancy. °· Are pregnant with more than one baby, such as twins. °TREATMENT  °Do not use any medicines (prescription, over-the-counter, or herbal) for morning sickness without first talking to your health care provider. Your health care provider may prescribe or recommend: °· Vitamin B6 supplements. °· Anti-nausea medicines. °· The herbal medicine ginger. °HOME CARE INSTRUCTIONS  °· Only take over-the-counter or prescription medicines as directed by your health care provider. °· Taking multivitamins before getting pregnant can prevent or decrease the severity of morning sickness in most women.   °· Eat a piece of dry toast or unsalted crackers before getting out of bed in the morning.   °· Eat five or six small meals a day.   °· Eat dry and bland foods (rice, baked potato ). Foods high in carbohydrates are often helpful.  °· Do not drink liquids with your meals. Drink liquids between meals.   °· Avoid greasy, fatty, and spicy foods.   °· Get someone to cook for you if the smell of any food causes nausea and vomiting.   °· If you feel nauseous after taking prenatal vitamins, take the vitamins at night or with a snack.  °· Snack  on protein foods (nuts, yogurt, cheese) between meals if you are hungry.   °· Eat unsweetened gelatins for desserts.   °· Wearing an acupressure wristband (worn for sea sickness) may be helpful.   °· Acupuncture may be helpful.   °· Do not smoke.   °· Get a humidifier to keep the air in your house free of odors.   °· Get plenty of fresh air. °SEEK MEDICAL CARE IF:  °· Your home remedies are not working, and you need medicine. °· You feel dizzy or lightheaded. °· You are losing weight. °SEEK IMMEDIATE MEDICAL CARE IF:  °· You have persistent and uncontrolled nausea and vomiting. °· You pass out (faint). °Document Released: 06/26/2006 Document Revised: 01/05/2013 Document Reviewed: 10/20/2012 °ExitCare® Patient Information ©2014 [employer]. ° °

## 2013-11-28 LAB — OB RESULTS CONSOLE HIV ANTIBODY (ROUTINE TESTING): HIV: NONREACTIVE

## 2013-11-28 LAB — OB RESULTS CONSOLE RPR: RPR: NONREACTIVE

## 2014-02-16 ENCOUNTER — Inpatient Hospital Stay (HOSPITAL_COMMUNITY): Payer: Medicaid Other | Admitting: Anesthesiology

## 2014-02-16 ENCOUNTER — Other Ambulatory Visit: Payer: Self-pay | Admitting: Obstetrics and Gynecology

## 2014-02-16 ENCOUNTER — Encounter (HOSPITAL_COMMUNITY): Payer: Self-pay | Admitting: *Deleted

## 2014-02-16 ENCOUNTER — Inpatient Hospital Stay (HOSPITAL_COMMUNITY)
Admission: AD | Admit: 2014-02-16 | Discharge: 2014-02-19 | DRG: 775 | Disposition: A | Discharge status: Home / Self Care | Payer: Medicaid Other | Source: Ambulatory Visit | Attending: Obstetrics and Gynecology | Admitting: Obstetrics and Gynecology

## 2014-02-16 ENCOUNTER — Encounter (HOSPITAL_COMMUNITY): Payer: Medicaid Other | Admitting: Anesthesiology

## 2014-02-16 ENCOUNTER — Encounter: Payer: Self-pay | Admitting: Obstetrics and Gynecology

## 2014-02-16 DIAGNOSIS — Z8249 Family history of ischemic heart disease and other diseases of the circulatory system: Secondary | ICD-10-CM | POA: Diagnosis not present

## 2014-02-16 DIAGNOSIS — O133 Gestational [pregnancy-induced] hypertension without significant proteinuria, third trimester: Principal | ICD-10-CM | POA: Diagnosis present

## 2014-02-16 DIAGNOSIS — Z833 Family history of diabetes mellitus: Secondary | ICD-10-CM

## 2014-02-16 DIAGNOSIS — O139 Gestational [pregnancy-induced] hypertension without significant proteinuria, unspecified trimester: Secondary | ICD-10-CM

## 2014-02-16 DIAGNOSIS — O99824 Streptococcus B carrier state complicating childbirth: Secondary | ICD-10-CM | POA: Diagnosis present

## 2014-02-16 DIAGNOSIS — Z3A38 38 weeks gestation of pregnancy: Secondary | ICD-10-CM | POA: Diagnosis present

## 2014-02-16 DIAGNOSIS — O1490 Unspecified pre-eclampsia, unspecified trimester: Secondary | ICD-10-CM | POA: Diagnosis present

## 2014-02-16 HISTORY — DX: Gestational (pregnancy-induced) hypertension without significant proteinuria, unspecified trimester: O13.9

## 2014-02-16 HISTORY — DX: 38 weeks gestation of pregnancy: Z3A.38

## 2014-02-16 LAB — CBC
HEMATOCRIT: 33.7 % — AB (ref 36.0–46.0)
Hemoglobin: 11.3 g/dL — ABNORMAL LOW (ref 12.0–15.0)
MCH: 31 pg (ref 26.0–34.0)
MCHC: 33.5 g/dL (ref 30.0–36.0)
MCV: 92.6 fL (ref 78.0–100.0)
PLATELETS: 359 10*3/uL (ref 150–400)
RBC: 3.64 MIL/uL — AB (ref 3.87–5.11)
RDW: 13.2 % (ref 11.5–15.5)
WBC: 10.5 10*3/uL (ref 4.0–10.5)

## 2014-02-16 LAB — COMPREHENSIVE METABOLIC PANEL
ALBUMIN: 3 g/dL — AB (ref 3.5–5.2)
ALT: 9 U/L (ref 0–35)
ANION GAP: 14 (ref 5–15)
AST: 19 U/L (ref 0–37)
Alkaline Phosphatase: 144 U/L — ABNORMAL HIGH (ref 39–117)
BILIRUBIN TOTAL: 0.7 mg/dL (ref 0.3–1.2)
BUN: 6 mg/dL (ref 6–23)
CALCIUM: 9.4 mg/dL (ref 8.4–10.5)
CO2: 19 mEq/L (ref 19–32)
CREATININE: 0.59 mg/dL (ref 0.50–1.10)
Chloride: 107 mEq/L (ref 96–112)
GFR calc Af Amer: >90 mL/min (ref >90)
Glucose, Bld: 65 mg/dL — ABNORMAL LOW (ref 70–99)
Potassium: 4 mEq/L (ref 3.7–5.3)
Sodium: 140 mEq/L (ref 137–147)
Total Protein: 6.5 g/dL (ref 6.0–8.3)

## 2014-02-16 LAB — OB RESULTS CONSOLE GBS: STREP GROUP B AG: POSITIVE

## 2014-02-16 LAB — ABO/RH: ABO/RH(D): A POS

## 2014-02-16 LAB — TYPE AND SCREEN
ABO/RH(D): A POS
ANTIBODY SCREEN: NEGATIVE

## 2014-02-16 LAB — LACTATE DEHYDROGENASE: LDH: 233 U/L (ref 94–250)

## 2014-02-16 LAB — URIC ACID: Uric Acid, Serum: 4.6 mg/dL (ref 2.4–7.0)

## 2014-02-16 MED ORDER — FENTANYL 2.5 MCG/ML BUPIVACAINE 1/10 % EPIDURAL INFUSION (WH - ANES)
INTRAMUSCULAR | Status: DC | PRN
  Administered 2014-02-16: 14 mL/h via EPIDURAL

## 2014-02-16 MED ORDER — CITRIC ACID-SODIUM CITRATE 334-500 MG/5ML PO SOLN: 30.0000 mL | ORAL | Status: DC | PRN

## 2014-02-16 MED ORDER — ONDANSETRON HCL 4 MG/2ML IJ SOLN
4.0000 mg | Freq: Four times a day (QID) | INTRAMUSCULAR | Status: DC | PRN
  Administered 2014-02-17: 4 mg via INTRAVENOUS
  Filled 2014-02-16: qty 2

## 2014-02-16 MED ORDER — PENICILLIN G POTASSIUM 5000000 UNITS IJ SOLR
5.0000 10*6.[IU] | Freq: Once | INTRAVENOUS | Status: AC
  Administered 2014-02-16: 5 10*6.[IU] via INTRAVENOUS
  Filled 2014-02-16: qty 5

## 2014-02-16 MED ORDER — LACTATED RINGERS IV SOLN: 500.0000 mL | INTRAVENOUS | Status: DC | PRN

## 2014-02-16 MED ORDER — DEXTROSE 5 % IV SOLN: 5.0000 10*6.[IU] | Freq: Once | INTRAVENOUS | Status: DC

## 2014-02-16 MED ORDER — BUTORPHANOL TARTRATE 1 MG/ML IJ SOLN: 1.0000 mg | INTRAMUSCULAR | Status: DC | PRN

## 2014-02-16 MED ORDER — DIPHENHYDRAMINE HCL 50 MG/ML IJ SOLN: 12.5000 mg | INTRAMUSCULAR | Status: DC | PRN

## 2014-02-16 MED ORDER — EPHEDRINE 5 MG/ML INJ
10.0000 mg | INTRAVENOUS | Status: DC | PRN
  Filled 2014-02-16: qty 2

## 2014-02-16 MED ORDER — TERBUTALINE SULFATE 1 MG/ML IJ SOLN: 0.2500 mg | Freq: Once | INTRAMUSCULAR | Status: AC | PRN

## 2014-02-16 MED ORDER — OXYCODONE-ACETAMINOPHEN 5-325 MG PO TABS: 2.0000 | ORAL_TABLET | ORAL | Status: DC | PRN

## 2014-02-16 MED ORDER — OXYTOCIN BOLUS FROM INFUSION
500.0000 mL | INTRAVENOUS | Status: DC
  Administered 2014-02-17: 500 mL via INTRAVENOUS

## 2014-02-16 MED ORDER — FENTANYL 2.5 MCG/ML BUPIVACAINE 1/10 % EPIDURAL INFUSION (WH - ANES)
14.0000 mL/h | INTRAMUSCULAR | Status: DC | PRN
  Administered 2014-02-16 – 2014-02-17 (×2): 14 mL/h via EPIDURAL
  Filled 2014-02-16 (×2): qty 125

## 2014-02-16 MED ORDER — LIDOCAINE HCL (PF) 1 % IJ SOLN: 30.0000 mL | INTRAMUSCULAR | Status: DC | PRN

## 2014-02-16 MED ORDER — OXYTOCIN 40 UNITS IN LACTATED RINGERS INFUSION - SIMPLE MED: 62.5000 mL/h | INTRAVENOUS | Status: DC

## 2014-02-16 MED ORDER — LACTATED RINGERS IV SOLN
500.0000 mL | INTRAVENOUS | Status: DC | PRN
Start: 2014-02-16 — End: 2014-02-17

## 2014-02-16 MED ORDER — OXYCODONE-ACETAMINOPHEN 5-325 MG PO TABS: 1.0000 | ORAL_TABLET | ORAL | Status: DC | PRN

## 2014-02-16 MED ORDER — LIDOCAINE HCL (PF) 1 % IJ SOLN
30.0000 mL | INTRAMUSCULAR | Status: DC | PRN
  Filled 2014-02-16: qty 30

## 2014-02-16 MED ORDER — ACETAMINOPHEN 325 MG PO TABS: 650.0000 mg | ORAL_TABLET | ORAL | Status: DC | PRN

## 2014-02-16 MED ORDER — LACTATED RINGERS IV SOLN: INTRAVENOUS | Status: DC

## 2014-02-16 MED ORDER — PHENYLEPHRINE 40 MCG/ML (10ML) SYRINGE FOR IV PUSH (FOR BLOOD PRESSURE SUPPORT)
80.0000 ug | PREFILLED_SYRINGE | INTRAVENOUS | Status: DC | PRN
  Filled 2014-02-16: qty 2
  Filled 2014-02-16: qty 10

## 2014-02-16 MED ORDER — OXYTOCIN 40 UNITS IN LACTATED RINGERS INFUSION - SIMPLE MED
1.0000 m[IU]/min | INTRAVENOUS | Status: DC
  Administered 2014-02-16: 2 m[IU]/min via INTRAVENOUS
  Filled 2014-02-16: qty 1000

## 2014-02-16 MED ORDER — LIDOCAINE HCL (PF) 1 % IJ SOLN
INTRAMUSCULAR | Status: DC | PRN
  Administered 2014-02-16: 8 mL
  Administered 2014-02-16: 9 mL

## 2014-02-16 MED ORDER — PHENYLEPHRINE 40 MCG/ML (10ML) SYRINGE FOR IV PUSH (FOR BLOOD PRESSURE SUPPORT)
80.0000 ug | PREFILLED_SYRINGE | INTRAVENOUS | Status: DC | PRN
  Filled 2014-02-16: qty 2

## 2014-02-16 MED ORDER — PENICILLIN G POTASSIUM 5000000 UNITS IJ SOLR: 2.5000 10*6.[IU] | INTRAVENOUS | Status: DC

## 2014-02-16 MED ORDER — ONDANSETRON HCL 4 MG/2ML IJ SOLN: 4.0000 mg | Freq: Four times a day (QID) | INTRAMUSCULAR | Status: DC | PRN

## 2014-02-16 MED ORDER — OXYTOCIN BOLUS FROM INFUSION
500.0000 mL | INTRAVENOUS | Status: DC
Start: 2014-02-16 — End: 2014-02-16

## 2014-02-16 MED ORDER — FLEET ENEMA 7-19 GM/118ML RE ENEM: 1.0000 | ENEMA | RECTAL | Status: DC | PRN

## 2014-02-16 MED ORDER — LACTATED RINGERS IV SOLN: 500.0000 mL | Freq: Once | INTRAVENOUS | Status: DC

## 2014-02-16 MED ORDER — PENICILLIN G POTASSIUM 5000000 UNITS IJ SOLR
2.5000 10*6.[IU] | INTRAVENOUS | Status: DC
  Administered 2014-02-16 – 2014-02-17 (×2): 2.5 10*6.[IU] via INTRAVENOUS
  Filled 2014-02-16 (×6): qty 2.5

## 2014-02-16 MED ORDER — LACTATED RINGERS IV SOLN
INTRAVENOUS | Status: DC
Start: 2014-02-16 — End: 2014-02-17
  Administered 2014-02-16: 16:00:00 via INTRAVENOUS

## 2014-02-16 NOTE — Anesthesia Preprocedure Evaluation (Signed)
Anesthesia Evaluation  Patient identified by MRN, date of birth, ID band Patient awake    Reviewed: Allergy & Precautions, H&P , NPO status , Patient's Chart, lab work & pertinent test results  Airway Mallampati: II TM Distance: >3 FB Neck ROM: full    Dental no notable dental hx.    Pulmonary neg pulmonary ROS,    Pulmonary exam normal       Cardiovascular hypertension,     Neuro/Psych negative neurological ROS  negative psych ROS   GI/Hepatic negative GI ROS, Neg liver ROS,   Endo/Other  negative endocrine ROS  Renal/GU negative Renal ROS     Musculoskeletal   Abdominal Normal abdominal exam  (+)   Peds  Hematology negative hematology ROS (+)   Anesthesia Other Findings   Reproductive/Obstetrics (+) Pregnancy                           Anesthesia Physical Anesthesia Plan  ASA: II  Anesthesia Plan: Epidural   Post-op Pain Management:    Induction:   Airway Management Planned:   Additional Equipment:   Intra-op Plan:   Post-operative Plan:   Informed Consent: I have reviewed the patients History and Physical, chart, labs and discussed the procedure including the risks, benefits and alternatives for the proposed anesthesia with the patient or authorized representative who has indicated his/her understanding and acceptance.     Plan Discussed with:   Anesthesia Plan Comments:         Anesthesia Quick Evaluation

## 2014-02-16 NOTE — H&P (Signed)
Elnora Elizabeth PalauFeamster is a 23 y.o. female G1P0 at 38+ with PIH for IOL.  Last week with normal platelets and LFTs - elevated then normalized BP.  Today elevated on recheck more elevated.  No PIH sx's.  +FM, no LOF, no VB, occ ctx.  D/W pt PIH and reasons for IOL  Maternal Medical History:  Contractions: Frequency: regular.    Fetal activity: Perceived fetal activity is normal.    Prenatal complications: PIH.   Prenatal Complications - Diabetes: none.    OB History   Grav Para Term Preterm Abortions TAB SAB Ect Mult Living   1             G1 present No STDs No abn pap  Past Medical History  Diagnosis Date  . PIH (pregnancy induced hypertension) 02/16/2014  . [redacted] weeks gestation of pregnancy 02/16/2014   Past Surgical History  Procedure Laterality Date  . Wisdom tooth extraction     Family History: Br Ca, Lung CA, HTN, DM Social History:  reports that she has never smoked. She does not have any smokeless tobacco history on file. She reports that she does not drink alcohol. 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   HENT: Negative.   Eyes: Negative.   Respiratory: Negative.   Cardiovascular: Negative.   Gastrointestinal: Negative.   Genitourinary: Negative.   Musculoskeletal: Negative.   Skin: Negative.   Neurological: Negative.   Psychiatric/Behavioral: Negative.     Dilation: 1.5 Effacement (%): 60 Station: -2 Exam by:: bp Temperature 98.2 F (36.8 C), temperature source Oral, resp. rate 18, height 5\' 1"  (1.549 m), weight 73.029 kg (161 lb), last menstrual period  05/20/2013. Maternal Exam:  Abdomen: Fundal height is appropriate for gestation.   Estimated fetal weight is 7.5#.   Fetal presentation: vertex  Introitus: Normal vulva. Normal vagina.  Pelvis: adequate for delivery.   Cervix: Cervix evaluated by digital exam.     Physical Exam  Constitutional: She is oriented to person, place, and time. She appears well-developed and well-nourished.  HENT:  Head: Normocephalic and atraumatic.  Cardiovascular: Normal rate and regular rhythm.   Respiratory: Effort normal and breath sounds normal. No respiratory distress. She has no wheezes.  GI: Soft. Bowel sounds are normal. She exhibits no distension. There is no tenderness.  Musculoskeletal: Normal range of motion.  Neurological: She is alert and oriented to person, place, and time.  Skin: Skin is warm and dry.  Psychiatric: She has a normal mood and affect. Her behavior is normal.    Prenatal labs: ABO, Rh: --/--/A POS (10/01 1625) Antibody: NEG (10/01 1625) Rubella: Immune (03/15 0000) RPR: Nonreactive (07/13 0000)  HBsAg: Negative (03/15 0000)  HIV: Non-reactive (07/13 0000)  GBS: Positive (10/01 0000)    Tdap 11/28/13 CF neg/Hgb 13.0/Ur Cx neg/GC neg/ Chl neg/Hgb electro WNL/First Tri Scr WNL/ glucola 119/  Nl anat, cwd, post plac, female  Nl LFTs and plts late September Assessment/Plan: 23yo G1P0 for IOL given term and PIH SVE 2cm at office Expect SVD Epidural and IV pain meds prn for pain   Bovard-Stuckert, Welford Christmas 02/16/2014, 6:08 PM

## 2014-02-16 NOTE — Progress Notes (Signed)
Patient ID: Tammy CageArtrice Mccann, female   DOB: 28-Oct-1990, 23 y.o.   MRN: [id number]  Getting uncomfortable with ctx  AF VSS (elevated BP) gen NAD FHTs 140s, mod var, category 2 toco Q 2-664min  SVE 3.4/70/-1  Going to get epidural Cont IOL/pitoxcin Expect SVD

## 2014-02-16 NOTE — Anesthesia Procedure Notes (Signed)
Epidural Patient location during procedure: OB Start time: 02/16/2014 10:21 PM End time: 02/16/2014 10:26 PM  Staffing Anesthesiologist: Leilani AbleHATCHETT, Kyria Bumgardner Performed by: anesthesiologist   Preanesthetic Checklist Completed: patient identified, surgical consent, pre-op evaluation, timeout performed, IV checked, risks and benefits discussed and monitors and equipment checked  Epidural Patient position: sitting Prep: site prepped and draped and DuraPrep Patient monitoring: continuous pulse ox and blood pressure Approach: midline Location: L3-L4 Injection technique: LOR air  Needle:  Needle type: Tuohy  Needle gauge: 17 G Needle length: 9 cm and 9 Needle insertion depth: 5 cm cm Catheter type: closed end flexible Catheter size: 19 Gauge Catheter at skin depth: 10 cm Test dose: negative and Other  Assessment Sensory level: T9 Events: blood not aspirated, injection not painful, no injection resistance, negative IV test and no paresthesia  Additional Notes Reason for block:procedure for pain

## 2014-02-17 ENCOUNTER — Encounter (HOSPITAL_COMMUNITY): Payer: Self-pay | Admitting: Obstetrics and Gynecology

## 2014-02-17 LAB — RPR

## 2014-02-17 MED ORDER — SODIUM BICARBONATE 8.4 % IV SOLN
INTRAVENOUS | Status: DC | PRN
  Administered 2014-02-17: 5 mL via EPIDURAL

## 2014-02-17 MED ORDER — BENZOCAINE-MENTHOL 20-0.5 % EX AERO
1.0000 "application " | INHALATION_SPRAY | CUTANEOUS | Status: DC | PRN
  Administered 2014-02-17 – 2014-02-18 (×2): 1 via TOPICAL
  Filled 2014-02-17 (×2): qty 56

## 2014-02-17 MED ORDER — OXYCODONE-ACETAMINOPHEN 5-325 MG PO TABS: 2.0000 | ORAL_TABLET | ORAL | Status: DC | PRN

## 2014-02-17 MED ORDER — ZOLPIDEM TARTRATE 5 MG PO TABS: 5.0000 mg | ORAL_TABLET | Freq: Every evening | ORAL | Status: DC | PRN

## 2014-02-17 MED ORDER — ONDANSETRON HCL 4 MG/2ML IJ SOLN: 4.0000 mg | INTRAMUSCULAR | Status: DC | PRN

## 2014-02-17 MED ORDER — LANOLIN HYDROUS EX OINT: TOPICAL_OINTMENT | CUTANEOUS | Status: DC | PRN

## 2014-02-17 MED ORDER — BUPIVACAINE HCL (PF) 0.25 % IJ SOLN
INTRAMUSCULAR | Status: DC | PRN
  Administered 2014-02-17: 8 mL via EPIDURAL

## 2014-02-17 MED ORDER — SENNOSIDES-DOCUSATE SODIUM 8.6-50 MG PO TABS
2.0000 | ORAL_TABLET | ORAL | Status: DC
  Administered 2014-02-17 – 2014-02-19 (×2): 2 via ORAL
  Filled 2014-02-17 (×2): qty 2

## 2014-02-17 MED ORDER — WITCH HAZEL-GLYCERIN EX PADS: 1.0000 "application " | MEDICATED_PAD | CUTANEOUS | Status: DC | PRN

## 2014-02-17 MED ORDER — PRENATAL MULTIVITAMIN CH
1.0000 | ORAL_TABLET | Freq: Every day | ORAL | Status: DC
  Administered 2014-02-17 – 2014-02-19 (×3): 1 via ORAL
  Filled 2014-02-17 (×2): qty 1

## 2014-02-17 MED ORDER — ONDANSETRON HCL 4 MG PO TABS: 4.0000 mg | ORAL_TABLET | ORAL | Status: DC | PRN

## 2014-02-17 MED ORDER — DIPHENHYDRAMINE HCL 25 MG PO CAPS: 25.0000 mg | ORAL_CAPSULE | Freq: Four times a day (QID) | ORAL | Status: DC | PRN

## 2014-02-17 MED ORDER — LACTATED RINGERS IV SOLN: INTRAVENOUS | Status: DC

## 2014-02-17 MED ORDER — IBUPROFEN 600 MG PO TABS
600.0000 mg | ORAL_TABLET | Freq: Four times a day (QID) | ORAL | Status: DC
  Administered 2014-02-17 – 2014-02-19 (×10): 600 mg via ORAL
  Filled 2014-02-17 (×9): qty 1

## 2014-02-17 MED ORDER — OXYCODONE-ACETAMINOPHEN 5-325 MG PO TABS
1.0000 | ORAL_TABLET | ORAL | Status: DC | PRN
  Administered 2014-02-17 – 2014-02-18 (×3): 1 via ORAL
  Filled 2014-02-17 (×3): qty 1

## 2014-02-17 MED ORDER — SIMETHICONE 80 MG PO CHEW: 80.0000 mg | CHEWABLE_TABLET | ORAL | Status: DC | PRN

## 2014-02-17 MED ORDER — DIBUCAINE 1 % RE OINT: 1.0000 "application " | TOPICAL_OINTMENT | RECTAL | Status: DC | PRN

## 2014-02-17 NOTE — Progress Notes (Signed)
Post Partum Day 0 Subjective: no complaints, up ad lib, tolerating PO and nl lochia, pain controlled  Objective: Blood pressure 125/68, pulse 74, temperature 99.5 F (37.5 C), temperature source Oral, resp. rate 18, height 5\' 1"  (1.549 m), weight 73.029 kg (161 lb), SpO2 100.00%, unknown if currently breastfeeding.  Physical Exam:  General: alert and no distress Lochia: appropriate Uterine Fundus: firm   Recent Labs  02/16/14 1625  HGB 11.3*  HCT 33.7*    Assessment/Plan: Plan for discharge tomorrow/Sunday and Lactation consult.  Routine care.     LOS: 1 day   Bovard-Stuckert, Charitie Hinote 02/17/2014, 8:30 AM

## 2014-02-17 NOTE — Progress Notes (Signed)
Vomited 250cc yellow material

## 2014-02-17 NOTE — Progress Notes (Signed)
Patient ID: Talbert CageArtrice Mccann, female   DOB: 1991/03/26, 23 y.o.   MRN: [id number]  Comfortable with epidural, some pressure  AFVSS gen NAD FHTs 120's some variables, category 2 toco Q 2-653min  SVE 10/100/+2  To start pushing, expect SVD

## 2014-02-17 NOTE — Lactation Note (Signed)
This note was copied from the chart of Tammy Mccann. Lactation Consultation Note  Patient Name: Tammy Talbert Cagertrice Munroe Today's Date: 02/17/2014 Reason for consult: Initial assessment of this first-time mom and baby, now 18 hours postpartum.  RN, Shanda BumpsJessica had initiated DEBP when baby unable to latch and mom obtained ebm (several ml's) which she spoon fed to baby.  When LC arrived, baby is sucking vigorously on pacifier and when pacifier removed, baby is cuing to feed. Mom willing to offer breast and LC assisted with football hold on (R).  Mom has large, supple nipples and easily expressed colostrum and baby latches easily with brief chin tug and initial stimulation, then rhythmical sucking bursts noted for about 10 minutes before baby fell asleep and mom removed her from breast.  No nipple pain or trauma noted.  LC reviewed pump use and cleaning, milk storage and options for use of hand pump parts of DEBP.Mom encouraged to feed baby 8-12 times/24 hours and with feeding cues. LC encouraged review of Baby and Me pp 9, 14 and 20-25 for STS and BF information. LC provided [employer] Resource brochure and reviewed [facility] services and list of community and web site resources. LC cautioned about early use of pacifier or bottle and encouraged mom to cue feed and place baby STS frequently.  Mom to call nurse or [facility] for latch assistance, as needed.      Maternal Data Formula Feeding for Exclusion: No Has patient been taught Hand Expression?: Yes (both RN and LC demonstrated and mom  has flowing colostrum) Does the patient have breastfeeding experience prior to this delivery?: No (mom is primipara)  Feeding Feeding Type: Breast Fed Length of feed: 10 min  LATCH Score/Interventions Latch: Grasps breast easily, tongue down, lips flanged, rhythmical sucking. (needed encouragement to open mouth wide; used chin tug)  Audible Swallowing: A few with stimulation Intervention(s): Skin to skin;Hand  expression Intervention(s): Skin to skin;Hand expression;Alternate breast massage  Type of Nipple: Everted at rest and after stimulation (large but supple nipples)  Comfort (Breast/Nipple): Soft / non-tender     Hold (Positioning): Assistance needed to correctly position infant at breast and maintain latch. Intervention(s): Breastfeeding basics reviewed;Support Pillows;Position options;Skin to skin  LATCH Score: 8  (LC assisted and observed)  Lactation Tools Discussed/Used Tools: Pump Breast pump type: Double-Electric Breast Pump WIC Program: Yes Pump Review: Setup, frequency, and cleaning;Milk Storage;Other (comment) (discussed options for discharge and showed use of hand pump) Initiated by:: RN, Shanda BumpsJessica Date initiated:: 02/17/14 STS, cue feeding, hand expression, nipple care, cautons regarding offering pacifier or bottle during first 2 weeks  Consult Status Consult Status: Follow-up Date: 02/18/14 Follow-up type: In-patient    Warrick ParisianBryant, Shontel Santee [facility] 02/17/2014, 9:34 PM

## 2014-02-17 NOTE — Anesthesia Postprocedure Evaluation (Signed)
  Anesthesia Post-op Note  Patient: Engineer, maintenanceArtrice Collingsworth  Procedure(s) Performed: * No procedures listed *  Patient Location: Mother/Baby  Anesthesia Type:Epidural  Level of Consciousness: awake, alert , oriented and patient cooperative  Airway and Oxygen Therapy: Patient Spontanous Breathing  Post-op Pain: mild  Post-op Assessment: Post-op Vital signs reviewed, Patient's Cardiovascular Status Stable, Respiratory Function Stable, Patent Airway, No signs of Nausea or vomiting, Adequate PO intake, Pain level controlled, No headache, No backache, No residual numbness and No residual motor weakness  Post-op Vital Signs: Reviewed and stable  Last Vitals:  Filed Vitals:   02/17/14 1135  BP: 132/79  Pulse: 80  Temp: 36.9 C  Resp: 16    Complications: No apparent anesthesia complications

## 2014-02-18 LAB — CBC
HCT: 29.1 % — ABNORMAL LOW (ref 36.0–46.0)
HEMOGLOBIN: 9.6 g/dL — AB (ref 12.0–15.0)
MCH: 31 pg (ref 26.0–34.0)
MCHC: 33 g/dL (ref 30.0–36.0)
MCV: 93.9 fL (ref 78.0–100.0)
Platelets: 317 10*3/uL (ref 150–400)
RBC: 3.1 MIL/uL — ABNORMAL LOW (ref 3.87–5.11)
RDW: 13.7 % (ref 11.5–15.5)
WBC: 11 10*3/uL — AB (ref 4.0–10.5)

## 2014-02-18 NOTE — Progress Notes (Signed)
PPD #1 No problems Afeb, VSS Fundus firm, NT at U-1 Continue routine postpartum care 

## 2014-02-18 NOTE — Lactation Note (Signed)
This note was copied from the chart of Tammy Beyza Strader. Lactation Consultation Note    Follow up consult with this mom and baby, now 6132 hours old. The mom was napping in fetal position, with baby on her back, sleeping nestled next to mom. Safe sleep reviewed with mom. Mom reports breast feeding going well. She was able to pup 15 mls of milk this morning and  Fed this to the baby. Mom  Denies any questions or concerns at this time. She has decided to stay in hospital until tomorrow, and knows to call for questions/concerns.   Patient Name: Tammy Talbert Cagertrice Bittick ZOXWR'UToday's Date: 02/18/2014 Reason for consult: Follow-up assessment   Maternal Data    Feeding    LATCH Score/Interventions                      Lactation Tools Discussed/Used     Consult Status Consult Status: Follow-up Date: 02/19/14 Follow-up type: In-patient    Alfred LevinsLee, [name] 02/18/2014, 11:20 AM

## 2014-02-19 MED ORDER — OXYCODONE-ACETAMINOPHEN 5-325 MG PO TABS: 1.0000 | ORAL_TABLET | ORAL | Status: AC | PRN

## 2014-02-19 MED ORDER — IBUPROFEN 600 MG PO TABS: 600.0000 mg | ORAL_TABLET | Freq: Four times a day (QID) | ORAL | Status: AC

## 2014-02-19 NOTE — Progress Notes (Signed)
PPD #2 No problems Afeb, VSS, BP normal Fundus firm D/c home

## 2014-02-19 NOTE — Discharge Instructions (Signed)
As per discharge pamphlet °

## 2014-02-19 NOTE — Lactation Note (Signed)
This note was copied from the chart of [name]. Lactation Consultation Note    Follow up consult with this mom and term baby, now 6756 hours old. Mom's milk has come in, she is full but soft. Breast care reviewed. i showed mom how to keep baby close with nose touching, to maintiam a dee latch. Mom has large nipples, and baby fights depth some at this time. Breast care reviewed. Mom knows to call for questions/concerns.   Patient Name: Girl Talbert Cagertrice Perkey ZOXWR'UToday's Date: 02/19/2014 Reason for consult: Follow-up assessment   Maternal Data    Feeding Feeding Type: Breast Fed Length of feed: 20 min  LATCH Score/Interventions Latch: Grasps breast easily, tongue down, lips flanged, rhythmical sucking.  Audible Swallowing: A few with stimulation  Type of Nipple: Everted at rest and after stimulation  Comfort (Breast/Nipple): Soft / non-tender     Hold (Positioning): No assistance needed to correctly position infant at breast.  LATCH Score: 9  Lactation Tools Discussed/Used     Consult Status Consult Status: Complete Follow-up type: Call as needed    Tammy Mccann, Tammy Mccann 02/19/2014, 11:39 AM

## 2014-02-19 NOTE — Discharge Summary (Signed)
Obstetric Discharge Summary Reason for Admission: induction of labor Prenatal Procedures: NST Intrapartum Procedures: spontaneous vaginal delivery Postpartum Procedures: none Complications-Operative and Postpartum: 1st degree perineal laceration Hemoglobin  Date Value Ref Range Status  02/18/2014 9.6* 12.0 - 15.0 g/dL Final     HCT  Date Value Ref Range Status  02/18/2014 29.1* 36.0 - 46.0 % Final    Physical Exam:  General: alert Lochia: appropriate Uterine Fundus: firm   Discharge Diagnoses: Term Pregnancy-delivered and gestational hypertension  Discharge Information: Date: 02/19/2014 Activity: pelvic rest Diet: routine Medications: Ibuprofen and Percocet Condition: stable Instructions: refer to practice specific booklet Discharge to: home Follow-up Information   Follow up with Bovard-Stuckert, Jody, MD. Schedule an appointment as soon as possible for a visit in 6 weeks.   Specialty:  Obstetrics and Gynecology   Contact information:   [address] [place] [address] [phone]       Newborn Data: Live born female  Birth Weight: 6 lb 7.2 oz (2925 g) APGAR: 8, 9  Home with mother.  Kenny Stern D 02/19/2014, 10:03 AM

## 2014-03-20 ENCOUNTER — Encounter (HOSPITAL_COMMUNITY): Payer: Self-pay | Admitting: Obstetrics and Gynecology
